# Patient Record
Sex: Male | Born: 1970 | Race: White | Hispanic: No | Marital: Married | Smoking: Never smoker
Health system: Southern US, Community
[De-identification: ages and names within clinical notes are randomized; demographics above are authoritative.]

## PROBLEM LIST (undated history)

## (undated) DIAGNOSIS — E785 Hyperlipidemia, unspecified: Secondary | ICD-10-CM

## (undated) DIAGNOSIS — K449 Diaphragmatic hernia without obstruction or gangrene: Secondary | ICD-10-CM

## (undated) DIAGNOSIS — K219 Gastro-esophageal reflux disease without esophagitis: Secondary | ICD-10-CM

## (undated) HISTORY — DX: Hyperlipidemia, unspecified: E78.5

## (undated) HISTORY — DX: Diaphragmatic hernia without obstruction or gangrene: K44.9

## (undated) HISTORY — PX: ULNAR NERVE REPAIR: SHX2594

## (undated) HISTORY — DX: Gastro-esophageal reflux disease without esophagitis: K21.9

---

## 2003-05-06 HISTORY — PX: ELBOW ARTHROSCOPY: SUR87

## 2008-09-20 ENCOUNTER — Encounter: Payer: Self-pay | Admitting: Family Medicine

## 2009-02-28 ENCOUNTER — Encounter: Payer: Self-pay | Admitting: Family Medicine

## 2009-05-02 ENCOUNTER — Encounter: Payer: Self-pay | Admitting: Family Medicine

## 2009-07-12 ENCOUNTER — Encounter: Payer: Self-pay | Admitting: Family Medicine

## 2009-07-13 ENCOUNTER — Encounter: Payer: Self-pay | Admitting: Family Medicine

## 2009-08-08 ENCOUNTER — Ambulatory Visit: Payer: Self-pay | Admitting: Family Medicine

## 2009-08-08 DIAGNOSIS — M542 Cervicalgia: Secondary | ICD-10-CM | POA: Insufficient documentation

## 2009-08-08 DIAGNOSIS — K219 Gastro-esophageal reflux disease without esophagitis: Secondary | ICD-10-CM | POA: Insufficient documentation

## 2009-08-08 DIAGNOSIS — E785 Hyperlipidemia, unspecified: Secondary | ICD-10-CM | POA: Insufficient documentation

## 2009-08-08 DIAGNOSIS — G5620 Lesion of ulnar nerve, unspecified upper limb: Secondary | ICD-10-CM | POA: Insufficient documentation

## 2009-08-14 ENCOUNTER — Telehealth: Payer: Self-pay | Admitting: Family Medicine

## 2009-09-05 ENCOUNTER — Ambulatory Visit: Payer: Self-pay | Admitting: Family Medicine

## 2009-09-05 DIAGNOSIS — G8190 Hemiplegia, unspecified affecting unspecified side: Secondary | ICD-10-CM | POA: Insufficient documentation

## 2009-09-05 DIAGNOSIS — M5412 Radiculopathy, cervical region: Secondary | ICD-10-CM | POA: Insufficient documentation

## 2009-09-07 ENCOUNTER — Encounter: Admission: RE | Admit: 2009-09-07 | Discharge: 2009-09-07 | Payer: Self-pay | Admitting: Family Medicine

## 2010-01-09 ENCOUNTER — Ambulatory Visit: Payer: Self-pay | Admitting: Family Medicine

## 2010-01-09 DIAGNOSIS — N529 Male erectile dysfunction, unspecified: Secondary | ICD-10-CM

## 2010-01-09 DIAGNOSIS — G4700 Insomnia, unspecified: Secondary | ICD-10-CM

## 2010-02-21 ENCOUNTER — Telehealth: Payer: Self-pay | Admitting: Family Medicine

## 2010-04-12 ENCOUNTER — Encounter: Payer: Self-pay | Admitting: Family Medicine

## 2010-06-04 NOTE — Assessment & Plan Note (Signed)
Summary: SLEEP PROBLEMS CYD   Vital Signs:  Patient profile:   40 year old male Height:      66.75 inches Weight:      182 pounds BMI:     28.82 Temp:     98.8 degrees F oral Pulse rate:   68 / minute Pulse rhythm:   regular BP sitting:   122 / 80  (right arm) Cuff size:   regular  Vitals Entered By: Linde Gillis CMA Duncan Dull) (January 09, 2010 3:11 PM) CC: trouble sleeping   History of Present Illness: 40 year old male:  Having a hard time sleeping. Ambien works pretty well for him.  Not a new things.   caffeine not after lunch occ breaks rule    Allergies: 1)  ! Sulfa   Impression & Recommendations:  Problem # 1:  INSOMNIA (ICD-780.52) >15 minutes spent in face to face time with patient, >50% spent in counselling or coordination of care: discussed sleep hygiene, stress. OTC meds, supplements. No tob, 2 cups of coffee a day.  Please see the patient instructions for a detailed list of plans and what was discussed with the patient.   His updated medication list for this problem includes:    Zolpidem Tartrate 10 Mg Tabs (Zolpidem tartrate) .Marland Kitchen... 1/2 or 1 by mouth at hs prn  Problem # 2:  ERECTILE DYSFUNCTION, ORGANIC (ICD-607.84)  His updated medication list for this problem includes:    Cialis 20 Mg Tabs (Tadalafil) .Marland Kitchen... 1 by mouth 30 mins before sex  Complete Medication List: 1)  Simvastatin 20 Mg Tabs (Simvastatin) .... Once everyday 2)  Zolpidem Tartrate 10 Mg Tabs (Zolpidem tartrate) .... 1/2 or 1 by mouth at hs prn 3)  Cialis 20 Mg Tabs (Tadalafil) .Marland Kitchen.. 1 by mouth 30 mins before sex  Patient Instructions: 1)  MELATONIN 3-5 MG by mouth 1 HOUR BEFORE NIGHT 2)  (ONE OF THE BELOW IS OK, NOT ALL AT ONCE) 3)  BENADRYL 25 - 50 MG by mouth PRIOR TO SLEEP 4)  UNISOM 1 PRIOR TO SLEEP 5)  DRAMAMINE 1 TAB PRIOR TO SLEEP Prescriptions: CIALIS 20 MG TABS (TADALAFIL) 1 by mouth 30 mins before sex  #4 x 11   Entered and Authorized by:   Hannah Beat MD   Signed by:    Hannah Beat MD on 01/09/2010   Method used:   Electronically to        [facility]* (retail)       [address]       [place], [place]  [address]       Ph: [phone]       Fax: [phone]   RxID:   [id number] ZOLPIDEM TARTRATE 10 MG  TABS (ZOLPIDEM TARTRATE) 1/2 or 1 by mouth at hs prn  #30 x 1   Entered and Authorized by:   Hannah Beat MD   Signed by:   Hannah Beat MD on 01/09/2010   Method used:   Print then Give to Patient   RxID:   [id number]   Current Allergies (reviewed today): ! SULFA

## 2010-06-04 NOTE — Progress Notes (Signed)
Summary: Neurosurg Referral update...  Phone Note Outgoing Call   Summary of Call: Called pt regarding Neurosurg referral. Pt says he went to the appt, and the physician did not know why he was there. Says the physician reviewed the notes. Pt says he never went back.  Calling Neurosurg office for notes.Daine Gip  February 21, 2010 10:06 AM  Initial call taken by: Daine Gip,  February 21, 2010 10:06 AM  Follow-up for Phone Call        ok, noted, complex case.  Follow-up by: Hannah Beat MD,  March 11, 2010 9:06 AM

## 2010-06-04 NOTE — Op Note (Signed)
Summary: Right Ulnar Nerve Decompression/[employer] in [place]  Right Ulnar Nerve Decompression/[employer] in [place]. [place]   Imported By: Maryln Gottron 08/13/2009 14:06:44  _____________________________________________________________________  External Attachment:    Type:   Image     Comment:   External Document

## 2010-06-04 NOTE — Assessment & Plan Note (Signed)
Summary: NEW PT TO EST/CLE   Vital Signs:  Patient profile:   40 year old male Height:      66.75 inches Weight:      184.4 pounds BMI:     29.20 Temp:     97.9 degrees F oral Pulse rate:   72 / minute Pulse rhythm:   regular BP sitting:   120 / 80  (left arm) Cuff size:   regular  Vitals Entered By: Benny Lennert CMA Duncan Dull) (Sep 05, 2009 8:59 AM)  History of Present Illness: Chief complaint new patient to be established   40 year old male:  Minimal improvement to no improvement in the neck. 3 months. Saw surgeon at [facility] again, additionally has seen 2 other neurologists at [facility]. Wanted to try Botox  Several neurologists in [facility] History of [facility] tumor in the family  Neck and scapular pain: Has been ongoing now for about 3 months. Has had some ulnar nerve issues that are chronic and some weakness. Had an exploratory surgery - in [place]. Did not really help. Not a decompression or transposition.   Now has developed some pain on the right side of his neck.   Also R medial trap is having some severe to moderate pain in that side.  Does not exercise too much anymore.   has done some whoulder work and some light shurugs.   Went to Dr. Jaclyn Shaggy, [place] and some adjustments with his neck. No real benefit from manipulation. A little straighter.  Additionally, has been to PT most recently, Tried some Voltaren and Zanaflex  EMG normal, R UE, done at [facility]. Showed some mild slowing.   Chol: on statin, stable, needs Zocor refills. Recent labs reviewed.  Allergies: 1)  ! Sulfa  Past History:  Past medical, surgical, family and social histories (including risk factors) reviewed, and no changes noted (except as noted below).  Past Medical History: Reviewed history from 08/08/2009 and no changes required. Hyperlipidemia GERD / hiatal hernia  Past Surgical History: Reviewed history from 08/08/2009 and no changes required. 2005, R elbow arthroscopy 2010, Exploratory R ulnar  nerve, chronic ulnar nerve partial   Family History: Reviewed history and no changes required. 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 MS:  See HPI. Neuro:  See HPI. Psych:  Denies depression; increased stress with inability to fully work.  Physical Exam  General:  Well-developed,well-nourished,in no acute distress; alert,appropriate and cooperative throughout examination Head:  Normocephalic and atraumatic without obvious abnormalities. No apparent alopecia or balding. Ears:  no external deformities.   Nose:  no external deformity.   Lungs:  Normal respiratory effort, chest expands symmetrically. Lungs are clear to auscultation, no crackles or wheezes. Heart:  Normal rate and regular rhythm. S1 and S2 normal without gallop, murmur, click, rub or other extra sounds. 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 Some neuropathic pain and decreased mild strength. Will obtain an MRI of the cervical spine  and brachial plexus to evaluate for potential cervical foraminal impingement, spinal cord edema, spinal stenosis, brachial plexus lesion, and given a family history of PANCO tumor.  MRI will dictate direction for subsequent follow-up.  Orders: Radiology Referral (Radiology)  Problem # 2:  WEAKNESS, RIGHT SIDE OF BODY (ICD-342.90)  Problem # 3:  CERVICAL RADICULOPATHY, RIGHT (ICD-723.4) additionally, reviewed scapular control exercises.  Problem # 4:   HYPERLIPIDEMIA (ICD-272.4)  His updated medication list for this problem includes:    Simvastatin 20 Mg Tabs (Simvastatin) ..... Once everyday  Complete Medication List: 1)  Simvastatin 20 Mg Tabs (Simvastatin) .... Once everyday 2)  Diclofenac Sodium 75 Mg Tbec (Diclofenac sodium) .Marland Kitchen.. 1 by mouth two times a day 3)  Tizanidine Hcl 4 Mg Tabs (Tizanidine hcl) .Marland Kitchen.. 1 by mouth at bedtime  Patient Instructions: 1)  Referral Appointment Information 2)  Day/Date: 3)  Time: 4)  Place/MD: 5)  Address: 6)  Phone/Fax: 7)  Patient given appointment information. Information/Orders faxed/mailed.  Prescriptions: SIMVASTATIN 20 MG TABS (SIMVASTATIN) once everyday  #30 x 11   Entered and Authorized by:   Hannah Beat MD   Signed by:   Hannah Beat MD on 09/05/2009   Method used:   Electronically to        [facility]* (retail)       [address]       [place], [place]  [address]       Ph: [phone]       Fax: [phone]   RxID:   [id number]   Current Allergies (reviewed today): ! SULFA

## 2010-06-04 NOTE — Assessment & Plan Note (Signed)
Summary: 30 MIN APPT. NECK PAIN PER DR Nhung Danko   Vital Signs:  Patient profile:   40 year old male Height:      66.75 inches Weight:      181.2 pounds BMI:     28.70 Temp:     97.8 degrees F oral Pulse rate:   72 / minute Pulse rhythm:   regular BP sitting:   118 / 78  (left arm) Cuff size:   regular  Vitals Entered By: Benny Lennert CMA Duncan Dull) (August 08, 2009 9:00 AM)  History of Present Illness: Chief complaint neck pain for 2 months  Has been ongoing now for about two months. Has had some ulnar nerve issues that are chronic and some weakness. Had an exploratory surgery - in [place]. Did not really help. Not a decompression or transposition.   Now has developed some pain on the right side of his neck.   Also had  a cough and some difficulty swallowing, but that continued.   Also R medial trap is having some severe to moderate pain in that side.  Does not exercise too much anymore.  Has done a little exercise, has done some whoulder work and some light shurugs.   Went to Dr. Jaclyn Shaggy, [place] and some adjustments with his neck. No real benefit from manipulation. A little straighter.   EMG normal, done at [facility]. Showed some mild slowing.   Seeing a PT next wed, to check str of hand. Sue Lush at [facility], [employer]. Having some massage therapy.  Did not have do much about his neck.     Pictures reviewed with some slight R sided SCM smaller compared to the R.  Chol: on statin, stable  Allergies (verified): 1)  ! 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 MS:  See HPI. Neuro:  See HPI.  Physical Exam  General:  Well-developed,well-nourished,in no acute distress; alert,appropriate and  cooperative throughout examination Head:  Normocephalic and atraumatic without obvious abnormalities. No apparent alopecia or balding. Ears:  no external deformities.   Nose:  no external deformity.   Neck:  No deformities, masses, or tenderness noted. Chest Wall:  No deformities, masses, tenderness or gynecomastia noted. Lungs:  normal respiratory effort.   Msk:  Grossly full ROM at cervical spine Neg spurlings nt at spinous processes  Prominent R elbow scar grip 5/5   Impression & Recommendations:  Problem # 1:  NECK PAIN (ICD-723.1) Assessment New cannot rule out small amount of foraminal impingement  ddd vs oa cannot be excluded  proceed conservatively - PT, then manipulation if no cont improvement  His updated medication list for this problem includes:    Diclofenac Sodium 75 Mg Tbec (Diclofenac sodium) .Marland Kitchen... 1 by mouth two times a day    Tizanidine Hcl 4 Mg Tabs (Tizanidine hcl) .Marland Kitchen... 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 Ave Filter or one of the other local elbow surgeons.  Problem # 3:  HYPERLIPIDEMIA (ICD-272.4)  His updated medication list for this problem includes:    Simvastatin 20 Mg Tabs (Simvastatin) ..... Once everyday  Complete Medication List: 1)  Simvastatin 20 Mg Tabs (Simvastatin) .... Once everyday 2)  Diclofenac Sodium 75 Mg Tbec (Diclofenac sodium) .Marland Kitchen.. 1 by mouth two times a day 3)  Tizanidine Hcl 4 Mg Tabs (Tizanidine hcl) .Marland Kitchen.. 1 by mouth at bedtime  Patient Instructions: 1)  Elbow surgeons: 2)  Jackquline Bosch: [facility] 3)  Referral Appointment Information 4)  Day/Date: 5)  Time: 6)  Place/MD: 7)  Address: 8)  Phone/Fax: 9)  Patient given appointment information. Information/Orders faxed/mailed.  Prescriptions: TIZANIDINE HCL 4 MG TABS (TIZANIDINE HCL) 1 by mouth at bedtime  #30 x 1   Entered and Authorized by:    Hannah Beat MD   Signed by:   Hannah Beat MD on 08/08/2009   Method used:   Print then Give to Patient   RxID:   [id number] DICLOFENAC SODIUM 75 MG TBEC (DICLOFENAC SODIUM) 1 by mouth two times a day  #60 x 1   Entered and Authorized by:   Hannah Beat MD   Signed by:   Hannah Beat MD on 08/08/2009   Method used:   Print then Give to Patient   RxID:   [id number]   Current Allergies (reviewed today): ! SULFA

## 2010-06-04 NOTE — Letter (Signed)
Summary: Patient Questionnaire  Patient Questionnaire   Imported By: Beau Fanny 08/10/2009 13:39:02  _____________________________________________________________________  External Attachment:    Type:   Image     Comment:   External Document

## 2010-06-04 NOTE — Miscellaneous (Signed)
Summary: Records from 1974 - 2010  Records from 1974 - 2010   Imported By: Maryln Gottron 08/13/2009 14:10:21  _____________________________________________________________________  External Attachment:    Type:   Image     Comment:   External Document

## 2010-06-04 NOTE — Progress Notes (Signed)
  Phone Note Call from Patient   Caller: Patient Summary of Call: Patient called to ask if you have had the opportunity to speak to Dr Jackquline Bosch yet? He asked me to go ahead and make him the consult. I am not sure what the reason is for this consult. Pls advise so I can make this appt for him. Wednesday and Thursday are his best days, Mondays are 3rd choice, No Tuesdays and Fridays are his last choice. Cell is best [phone]. Patient thought by making the appt that it would take awhile and this would give you a chance to speak to Dr Ave Filter about him. Initial call taken by: Carlton Adam,  August 14, 2009 8:22 AM  Follow-up for Phone Call        done Follow-up by: Hannah Beat MD,  August 14, 2009 8:33 AM

## 2010-06-06 NOTE — Letter (Signed)
Summary: [facility]  [facility]   Imported By: Maryln Gottron 04/19/2010 14:55:21  _____________________________________________________________________  External Attachment:    Type:   Image     Comment:   External Document

## 2010-06-14 ENCOUNTER — Encounter: Payer: Self-pay | Admitting: Family Medicine

## 2010-06-26 NOTE — Letter (Signed)
Summary: [facility]  [facility]   Imported By: Maryln Gottron 06/21/2010 13:05:53  _____________________________________________________________________  External Attachment:    Type:   Image     Comment:   External Document

## 2010-07-11 ENCOUNTER — Encounter: Payer: Self-pay | Admitting: Family Medicine

## 2010-08-05 ENCOUNTER — Telehealth: Payer: Self-pay | Admitting: *Deleted

## 2010-08-05 DIAGNOSIS — M542 Cervicalgia: Secondary | ICD-10-CM

## 2010-08-05 NOTE — Telephone Encounter (Signed)
Pt wants referral to physical therapist at [facility], for right side neck pain.

## 2010-08-07 ENCOUNTER — Telehealth: Payer: Self-pay | Admitting: *Deleted

## 2010-08-07 NOTE — Telephone Encounter (Signed)
I did this 2 days ago. Did it not work somehow, please respond yes or no or if still pending.

## 2010-08-07 NOTE — Telephone Encounter (Signed)
Physical therapist from [facility] called stating that pt needs written order for physical therapy for neck and right shoulder problem.  Says he has had order before, has ongoing problems with pain.  Please fax to [phone].

## 2010-08-21 ENCOUNTER — Encounter: Payer: Self-pay | Admitting: Family Medicine

## 2010-08-27 NOTE — Telephone Encounter (Signed)
Per Shirlee Limerick, this has been done.

## 2010-09-03 ENCOUNTER — Other Ambulatory Visit: Payer: Self-pay | Admitting: Family Medicine

## 2010-09-03 ENCOUNTER — Encounter: Payer: Self-pay | Admitting: Family Medicine

## 2010-09-03 DIAGNOSIS — Z131 Encounter for screening for diabetes mellitus: Secondary | ICD-10-CM

## 2010-09-03 DIAGNOSIS — R5381 Other malaise: Secondary | ICD-10-CM

## 2010-09-03 DIAGNOSIS — Z13220 Encounter for screening for lipoid disorders: Secondary | ICD-10-CM

## 2010-09-06 ENCOUNTER — Other Ambulatory Visit: Payer: Self-pay

## 2010-09-09 ENCOUNTER — Other Ambulatory Visit: Payer: Self-pay | Admitting: *Deleted

## 2010-09-09 MED ORDER — SIMVASTATIN 20 MG PO TABS: 20.0000 mg | ORAL_TABLET | Freq: Every day | ORAL | Status: DC

## 2010-09-10 MED ORDER — ZOLPIDEM TARTRATE 10 MG PO TABS: ORAL_TABLET | ORAL | Status: DC

## 2010-09-10 NOTE — Telephone Encounter (Signed)
Please call in ambien 10 mg, as written, #30, 2 refills

## 2010-09-11 ENCOUNTER — Encounter: Payer: Self-pay | Admitting: Family Medicine

## 2010-09-20 ENCOUNTER — Other Ambulatory Visit: Payer: Self-pay

## 2010-09-25 ENCOUNTER — Encounter: Payer: Self-pay | Admitting: Family Medicine

## 2010-10-02 ENCOUNTER — Telehealth: Payer: Self-pay | Admitting: *Deleted

## 2010-10-02 NOTE — Telephone Encounter (Signed)
Pt is asking that you call him regarding his needing to go on antidepressant.  He is seeing a psychologist who recommends that he take one for about 8 months, pt doesn't really want to do that and wants to discuss it with you.  Try home number first and then mobile number.

## 2010-10-04 ENCOUNTER — Encounter: Payer: Self-pay | Admitting: Family Medicine

## 2010-11-11 ENCOUNTER — Other Ambulatory Visit: Payer: Self-pay | Admitting: *Deleted

## 2010-11-11 NOTE — Telephone Encounter (Signed)
I did not write for this. He was seeing a psychiatrist who wrote for medication, refill should go to them. Let pharmacy know we are not prescribing MD.

## 2010-11-11 NOTE — Telephone Encounter (Signed)
This medication was not on medication list, please advise.

## 2011-04-02 ENCOUNTER — Other Ambulatory Visit (INDEPENDENT_AMBULATORY_CARE_PROVIDER_SITE_OTHER): Payer: BC Managed Care – PPO

## 2011-04-02 DIAGNOSIS — R5381 Other malaise: Secondary | ICD-10-CM

## 2011-04-02 DIAGNOSIS — Z131 Encounter for screening for diabetes mellitus: Secondary | ICD-10-CM

## 2011-04-02 DIAGNOSIS — Z13220 Encounter for screening for lipoid disorders: Secondary | ICD-10-CM

## 2011-04-02 LAB — BASIC METABOLIC PANEL
BUN: 14 mg/dL (ref 6–23)
Calcium: 9.1 mg/dL (ref 8.4–10.5)
Chloride: 102 mEq/L (ref 96–112)
Creatinine, Ser: 1.1 mg/dL (ref 0.4–1.5)
GFR: 83.97 mL/min (ref >60.00)

## 2011-04-02 LAB — CBC WITH DIFFERENTIAL/PLATELET
Basophils Absolute: 0 10*3/uL (ref 0.0–0.1)
Eosinophils Relative: 5.7 % — ABNORMAL HIGH (ref 0.0–5.0)
HCT: 45.6 % (ref 39.0–52.0)
Lymphs Abs: 1.7 10*3/uL (ref 0.7–4.0)
MCV: 88.9 fl (ref 78.0–100.0)
Monocytes Absolute: 0.5 10*3/uL (ref 0.1–1.0)
Monocytes Relative: 8.1 % (ref 3.0–12.0)
Neutrophils Relative %: 59 % (ref 43.0–77.0)
Platelets: 253 10*3/uL (ref 150.0–400.0)
RBC: 5.13 Mil/uL (ref 4.22–5.81)
RDW: 12.8 % (ref 11.5–14.6)

## 2011-04-02 LAB — HEPATIC FUNCTION PANEL
AST: 31 U/L (ref 0–37)
Albumin: 3.5 g/dL (ref 3.5–5.2)
Alkaline Phosphatase: 67 U/L (ref 39–117)
Bilirubin, Direct: 0.1 mg/dL (ref 0.0–0.3)
Total Protein: 7 g/dL (ref 6.0–8.3)

## 2011-04-02 LAB — LIPID PANEL: LDL Cholesterol: 104 mg/dL — ABNORMAL HIGH (ref 0–99)

## 2011-04-07 ENCOUNTER — Ambulatory Visit (INDEPENDENT_AMBULATORY_CARE_PROVIDER_SITE_OTHER): Payer: BC Managed Care – PPO | Admitting: Family Medicine

## 2011-04-07 ENCOUNTER — Encounter: Payer: Self-pay | Admitting: Family Medicine

## 2011-04-07 VITALS — BP 130/80 | HR 68 | Temp 98.2°F | Ht 67.0 in | Wt 184.8 lb

## 2011-04-07 DIAGNOSIS — M722 Plantar fascial fibromatosis: Secondary | ICD-10-CM

## 2011-04-07 DIAGNOSIS — Z0000 Encounter for general adult medical examination without abnormal findings: Secondary | ICD-10-CM

## 2011-04-07 DIAGNOSIS — N529 Male erectile dysfunction, unspecified: Secondary | ICD-10-CM

## 2011-04-07 DIAGNOSIS — Z23 Encounter for immunization: Secondary | ICD-10-CM

## 2011-04-07 MED ORDER — VARDENAFIL HCL 20 MG PO TABS: 20.0000 mg | ORAL_TABLET | ORAL | Status: DC | PRN

## 2011-04-07 NOTE — Progress Notes (Signed)
Patient Name: JAKYE MULLENS Date of Birth: [birth year] Age: 40 y.o. Medical Record Number: [id number] Gender: male  History of Present Illness:  JAIRE PINKHAM is a 40 y.o. very pleasant male patient who presents with the following:  Flu - flu mist tdap -   Preventative Health Maintenance Visit:  Health Maintenance Summary Reviewed and updated, unless pt declines services.  Tobacco History Reviewed. Alcohol: No concerns, no excessive use Exercise Habits: minimal right now STD concerns: no risk or activity to increase risk Drug Use: None Encouraged self-testicular check  Health Maintenance  Topic Date Due  . Influenza Vaccine  02/03/2012  . Tetanus/tdap  04/06/2021    Labs reviewed with the patient.   Lipids:    Component Value Date/Time   CHOL 184 04/02/2011 0753   TRIG 140.0 04/02/2011 0753   HDL 52.40 04/02/2011 0753   VLDL 28.0 04/02/2011 0753   CHOLHDL 4 04/02/2011 0753    CBC:    Component Value Date/Time   WBC 6.5 04/02/2011 0753   HGB 15.4 04/02/2011 0753   HCT 45.6 04/02/2011 0753   PLT 253.0 04/02/2011 0753   MCV 88.9 04/02/2011 0753   NEUTROABS 3.8 04/02/2011 0753   LYMPHSABS 1.7 04/02/2011 0753   MONOABS 0.5 04/02/2011 0753   EOSABS 0.4 04/02/2011 0753   BASOSABS 0.0 04/02/2011 0753    Basic Metabolic Panel:    Component Value Date/Time   NA 139 04/02/2011 0753   K 4.2 04/02/2011 0753   CL 102 04/02/2011 0753   CO2 29 04/02/2011 0753   BUN 14 04/02/2011 0753   CREATININE 1.1 04/02/2011 0753   GLUCOSE 93 04/02/2011 0753   CALCIUM 9.1 04/02/2011 0753    Lab Results  Component Value Date   ALT 36 04/02/2011   AST 31 04/02/2011   ALKPHOS 67 04/02/2011   BILITOT 0.8 04/02/2011     Plantar fascitis - first [place] on the beach.  Having some plantar fascitis - has gotten a lot worse recently. Hobbling to the bathroom -- now not only when going to the bathroom, but when getting up.   The patient presents with a 2 year long  history of heel pain. This is notable for worsening pain first thing in the morning when arising and standing after sitting.   Prior foot or ankle fractures: none Prior operations: none Orthotics or bracing: custom orthotics Medications: NSAIDS PT or home rehab: none Night splints: no Ice massage: no Ball massage: no  Metatarsal pain: no  Patient Active Problem List  Diagnoses  . HYPERLIPIDEMIA  . WEAKNESS, RIGHT SIDE OF BODY  . ULNAR NEUROPATHY, RIGHT  . GERD  . ERECTILE DYSFUNCTION, ORGANIC  . NECK PAIN  . CERVICAL RADICULOPATHY, RIGHT  . INSOMNIA   Past Medical History  Diagnosis Date  . Hyperlipidemia   . GERD (gastroesophageal reflux disease)   . Hiatal hernia    Past Surgical History  Procedure Date  . Elbow arthroscopy 2005    right  . Ulnar nerve repair     exploratory right, chronic ulnar nerve partial   History  Substance Use Topics  . Smoking status: Never Smoker   . Smokeless tobacco: Not on file  . Alcohol Use: Yes   No family history on file. Allergies  Allergen Reactions  . Sulfonamide Derivatives      Review of Systems:  General: Denies fever, chills, sweats. No significant weight loss. Eyes: Denies blurring,significant itching ENT: Denies earache, sore throat, and hoarseness. Cardiovascular: Denies chest pains,  palpitations, dyspnea on exertion Respiratory: Denies cough, dyspnea at rest,wheeezing Breast: no concerns about lumps GI: Denies nausea, vomiting, diarrhea, constipation, change in bowel habits, abdominal pain, melena, hematochezia. HAVING SOME SENSATION OF FOOD CATCHING WHILE SWALLOWING - HAS AN UPCOMING EGD WITH DR. Markham Jordan GU: Denies penile discharge, OCC ED, NO urinary flow / outflow problems. No STD concerns. Musculoskeletal: CONT PROBLEMS WITH R HAND / ULNAR NERVE, SOME ME SYMPTOMS AS WELL. Derm: Denies rash, itching Neuro: Denies  paresthesias, frequent falls, frequent headaches Psych: Denies depression, anxiety Endocrine:  Denies cold intolerance, heat intolerance, polydipsia Heme: Denies enlarged lymph nodes Allergy: No hayfever   Physical Examination: Filed Vitals:   04/07/11 1156  BP: 130/80  Pulse: 68  Temp: 98.2 F (36.8 C)  TempSrc: Oral  Height: 5\' 7"  (1.702 m)  Weight: 184 lb 12.8 oz (83.825 kg)  SpO2: 99%   Body mass index is 28.94 kg/(m^2).   Wt Readings from Last 3 Encounters:  04/07/11 184 lb 12.8 oz (83.825 kg)  01/09/10 182 lb (82.555 kg)  09/05/09 184 lb 6.4 oz (83.643 kg)    GEN: well developed, well nourished, no acute distress Eyes: conjunctiva and lids normal, PERRLA, EOMI ENT: TM clear, nares clear, oral exam WNL Neck: supple, no lymphadenopathy, no thyromegaly, no JVD Pulm: clear to auscultation and percussion, respiratory effort normal CV: regular rate and rhythm, S1-S2, no murmur, rub or gallop, no bruits, peripheral pulses normal and symmetric, no cyanosis, clubbing, edema or varicosities Chest: no scars, masses GI: soft, non-tender; no hepatosplenomegaly, masses; active bowel sounds all quadrants GU: no hernia, testicular mass, penile discharge Lymph: no cervical, axillary or inguinal adenopathy MSK: gait normal, muscle tone and strength WNL, no joint swelling, effusions, discoloration, crepitus  SKIN: clear, good turgor, color WNL, no rashes, lesions, or ulcerations Neuro: normal mental status, normal strength, sensation, and motion Psych: alert; oriented to person, place and time, normally interactive and not anxious or depressed in appearance.   Foot exam, r Echymosis: no Edema: no ROM: full LE B Gait: heel toe, non-antalgic MT pain: no Callus pattern: none Lateral Mall: NT Medial Mall: NT Talus: NT Navicular: NT Calcaneous: NT Metatarsals: NT 5th MT: NT Phalanges: NT Achilles: NT Plantar Fascia: tender, medial along PF. Pain with forced dorsi Fat Pad: NT Peroneals: NT Post Tib: NT Great Toe: Nml motion Ant Drawer: neg Other foot breakdown:  none Long arch: preserved Transverse arch: preserved Hindfoot breakdown: none Sensation: intact   Assessment and Plan:  1. Routine general medical examination at a health care facility    2. Need for Tdap vaccination  Tdap vaccine greater than or equal to 7yo IM  3. Plantar fascia syndrome     The patient's preventative maintenance and recommended screening tests for an annual wellness exam were reviewed in full today. Brought up to date unless services declined.  Counselled on the importance of diet, exercise, and its role in overall health and mortality. The patient's FH and SH was reviewed, including their home life, tobacco status, and drug and alcohol status.   Plantar Fascitis: We reviewed that stretching is critically important to the treatment of PF. Reviewed footwear. Rigid soles have been shown to help with PF. Reviewed rehab of stretching and calf raises.  Refer to the patient instructions sections for details of plan shared with patient.   ED: trial of Levitra, change from cialis -- 10 dose there losing some effectiveness  Orders Placed This Encounter  Procedures  . Tdap vaccine greater than or equal  to 7yo IM

## 2011-04-07 NOTE — Patient Instructions (Addendum)
SHOES: Danskos, Merrells, Keens, Lawrence, Finn Comfort - good arch support, want minimal bendability [employer] in [place]: excellent staff, shoe selection, OTC orthotics Shoes: Birkenstock shoes, [employer] THE [place], [address] [place], [place]   PF:  STRETCHING and Strengthening program critically important.  Strengthening on foot and calf muscles as seen in handout. Calf raises, 2 legged, then 1 legged. Foot massage with tennis ball. Ice massage.  Towel Scrunches: get a towel or hand towel, use toes to pick up and scrunch up the towel.  Marble pick-ups, practice picking up marbles with toes and placing into a cup  NEEDS TO BE DONE EVERY DAY  Recommended over the counter insoles. (Spenco or Hapad)  A rigid shoe with good arch support helps: Dansko (great), Randel Pigg, Merrell No easily bendable shoes.   Tuli's heel cups

## 2011-04-25 ENCOUNTER — Ambulatory Visit: Payer: Self-pay | Admitting: Unknown Physician Specialty

## 2011-05-05 ENCOUNTER — Ambulatory Visit: Payer: BC Managed Care – PPO | Admitting: Family Medicine

## 2011-05-21 ENCOUNTER — Encounter: Payer: Self-pay | Admitting: Family Medicine

## 2011-06-27 ENCOUNTER — Ambulatory Visit: Payer: Self-pay | Admitting: Unknown Physician Specialty

## 2011-08-04 ENCOUNTER — Other Ambulatory Visit: Payer: Self-pay | Admitting: *Deleted

## 2011-08-08 ENCOUNTER — Other Ambulatory Visit: Payer: Self-pay | Admitting: *Deleted

## 2011-08-08 NOTE — Telephone Encounter (Signed)
Patient requesting rx for cialis to be sent to [facility]. [facility] but, doesn't like that medication

## 2011-08-10 NOTE — Telephone Encounter (Signed)
cialis 20 mg, 1/2 to 1 po 30 min before intercourse, #10, 11 refills  Please place on med list

## 2011-08-11 MED ORDER — TADALAFIL 20 MG PO TABS
20.0000 mg | ORAL_TABLET | Freq: Every day | ORAL | Status: DC | PRN
Start: 2011-08-11 — End: 2012-07-14

## 2011-09-19 ENCOUNTER — Other Ambulatory Visit: Payer: Self-pay | Admitting: *Deleted

## 2011-09-19 MED ORDER — SIMVASTATIN 20 MG PO TABS: 20.0000 mg | ORAL_TABLET | Freq: Every day | ORAL | Status: DC

## 2012-06-28 ENCOUNTER — Other Ambulatory Visit (INDEPENDENT_AMBULATORY_CARE_PROVIDER_SITE_OTHER): Payer: BC Managed Care – PPO

## 2012-06-28 DIAGNOSIS — Z79899 Other long term (current) drug therapy: Secondary | ICD-10-CM

## 2012-06-28 DIAGNOSIS — Z13220 Encounter for screening for lipoid disorders: Secondary | ICD-10-CM

## 2012-06-28 LAB — CBC WITH DIFFERENTIAL/PLATELET
Basophils Absolute: 0 10*3/uL (ref 0.0–0.1)
Eosinophils Absolute: 0.2 10*3/uL (ref 0.0–0.7)
Lymphs Abs: 1.9 10*3/uL (ref 0.7–4.0)
Monocytes Absolute: 0.5 10*3/uL (ref 0.1–1.0)
Neutro Abs: 4.3 10*3/uL (ref 1.4–7.7)
Neutrophils Relative %: 61.6 % (ref 43.0–77.0)
Platelets: 300 10*3/uL (ref 150.0–400.0)
RBC: 5.18 Mil/uL (ref 4.22–5.81)
RDW: 12.9 % (ref 11.5–14.6)
WBC: 7 10*3/uL (ref 4.5–10.5)

## 2012-06-28 LAB — HEPATIC FUNCTION PANEL
ALT: 33 U/L (ref 0–53)
Albumin: 3.8 g/dL (ref 3.5–5.2)
Total Bilirubin: 0.4 mg/dL (ref 0.3–1.2)
Total Protein: 7.5 g/dL (ref 6.0–8.3)

## 2012-06-28 LAB — BASIC METABOLIC PANEL
BUN: 11 mg/dL (ref 6–23)
Calcium: 9.5 mg/dL (ref 8.4–10.5)
Chloride: 105 mEq/L (ref 96–112)
Creatinine, Ser: 0.9 mg/dL (ref 0.4–1.5)
Potassium: 4.1 mEq/L (ref 3.5–5.1)

## 2012-07-14 ENCOUNTER — Ambulatory Visit (INDEPENDENT_AMBULATORY_CARE_PROVIDER_SITE_OTHER): Payer: BC Managed Care – PPO | Admitting: Family Medicine

## 2012-07-14 ENCOUNTER — Encounter: Payer: Self-pay | Admitting: Family Medicine

## 2012-07-14 VITALS — BP 120/78 | HR 70 | Temp 98.3°F | Ht 67.0 in | Wt 188.8 lb

## 2012-07-14 DIAGNOSIS — G5620 Lesion of ulnar nerve, unspecified upper limb: Secondary | ICD-10-CM

## 2012-07-14 DIAGNOSIS — Z0000 Encounter for general adult medical examination without abnormal findings: Secondary | ICD-10-CM

## 2012-07-14 DIAGNOSIS — G5621 Lesion of ulnar nerve, right upper limb: Secondary | ICD-10-CM

## 2012-07-14 MED ORDER — TADALAFIL 20 MG PO TABS: 20.0000 mg | ORAL_TABLET | Freq: Every day | ORAL | Status: DC | PRN

## 2012-07-14 NOTE — Progress Notes (Signed)
Nature conservation officer at [facility] [address] [place] [address] Phone: [phone] Fax: [phone]  Date:  07/14/2012   Name:  Jesse Horn   DOB:  10-31-1970   MRN:  [id number] Gender: male Age: 42 y.o.  Primary Physician:  Hannah Beat, MD  Evaluating MD: Hannah Beat, MD   Chief Complaint: Annual Exam   History of Present Illness:  Jesse Horn is a 42 y.o. pleasant patient who presents with the following:  42 yo:   R sided ulnar nerve problems continue.  Getting some CTS on the right.   He now has been able to work, and do everything from a dental standpoint, but he has had to alter his grip, and has minimal use of his 4th finger.  From a general health maintenance standpoint, he is doing well, however, he has not been able to work out quite as much as he wants to. His upper extremity lifting has been limited some with his elbow.  Preventative Health Maintenance Visit:  Health Maintenance Summary Reviewed and updated, unless pt declines services.  Tobacco History Reviewed. Alcohol: No concerns, no excessive use Exercise Habits: Some activity, rec at least 30 mins 5 times a week STD concerns: no risk or activity to increase risk Drug Use: None Encouraged self-testicular check  Health Maintenance  Topic Date Due  . Influenza Vaccine  01/04/2012  . Tetanus/tdap  04/06/2021    Labs reviewed with the patient.  Results for orders placed in visit on 06/28/12  HEPATIC FUNCTION PANEL      Result Value Range   Total Bilirubin 0.4  0.3 - 1.2 mg/dL   Bilirubin, Direct 0.0  0.0 - 0.3 mg/dL   Alkaline Phosphatase 61  39 - 117 U/L   AST 26  0 - 37 U/L   ALT 33  0 - 53 U/L   Total Protein 7.5  6.0 - 8.3 g/dL   Albumin 3.8  3.5 - 5.2 g/dL  CBC WITH DIFFERENTIAL      Result Value Range   WBC 7.0  4.5 - 10.5 K/uL   RBC 5.18  4.22 - 5.81 Mil/uL   Hemoglobin 15.1  13.0 - 17.0 g/dL   HCT 95.2  84.1 - 32.4 %   MCV 86.3  78.0 - 100.0 fl   MCHC 33.8  30.0 - 36.0 g/dL   RDW 40.1  02.7 - 25.3 %   Platelets 300.0  150.0 - 400.0 K/uL   Neutrophils Relative 61.6  43.0 - 77.0 %   Lymphocytes Relative 27.3  12.0 - 46.0 %   Monocytes Relative 7.2  3.0 - 12.0 %   Eosinophils Relative 3.2  0.0 - 5.0 %   Basophils Relative 0.7  0.0 - 3.0 %   Neutro Abs 4.3  1.4 - 7.7 K/uL   Lymphs Abs 1.9  0.7 - 4.0 K/uL   Monocytes Absolute 0.5  0.1 - 1.0 K/uL   Eosinophils Absolute 0.2  0.0 - 0.7 K/uL   Basophils Absolute 0.0  0.0 - 0.1 K/uL  BASIC METABOLIC PANEL      Result Value Range   Sodium 141  135 - 145 mEq/L   Potassium 4.1  3.5 - 5.1 mEq/L   Chloride 105  96 - 112 mEq/L   CO2 29  19 - 32 mEq/L   Glucose, Bld 87  70 - 99 mg/dL   BUN 11  6 - 23 mg/dL   Creatinine, Ser 0.9  0.4 - 1.5  mg/dL   Calcium 9.5  8.4 - 86.5 mg/dL   GFR 78.46  >96.29 mL/min  LDL CHOLESTEROL, DIRECT      Result Value Range   Direct LDL 97.8       Patient Active Problem List  Diagnosis  . HYPERLIPIDEMIA  . WEAKNESS, RIGHT SIDE OF BODY  . ULNAR NEUROPATHY, RIGHT  . GERD  . ERECTILE DYSFUNCTION, ORGANIC  . NECK PAIN  . CERVICAL RADICULOPATHY, RIGHT  . INSOMNIA    Past Medical History  Diagnosis Date  . Hyperlipidemia   . GERD (gastroesophageal reflux disease)   . Hiatal hernia     Past Surgical History  Procedure Laterality Date  . Elbow arthroscopy  2005    right  . Ulnar nerve repair      exploratory right, chronic ulnar nerve partial    History   Social History  . Marital Status: Married    Spouse Name: N/A    Number of Children: N/A  . Years of Education: N/A   Occupational History  . [employer]    Social History Main Topics  . Smoking status: Never Smoker   . Smokeless tobacco: Not on file  . Alcohol Use: Yes  . Drug Use:   . Sexually Active:    Other Topics Concern  . Not on file   Social History Narrative  . No narrative on file    No family history on file.  Allergies  Allergen Reactions  . Sulfonamide  Derivatives     Medication list has been reviewed and updated.  Current Outpatient Prescriptions on File Prior to Visit  Medication Sig Dispense Refill  . simvastatin (ZOCOR) 20 MG tablet Take 1 tablet (20 mg total) by mouth daily.  30 tablet  11   No current facility-administered medications on file prior to visit.     Review of Systems:   General: Denies fever, chills, sweats. No significant weight loss. Eyes: Denies blurring,significant itching ENT: Denies earache, sore throat, and hoarseness. Cardiovascular: Denies chest pains, palpitations, dyspnea on exertion Respiratory: Denies cough, dyspnea at rest,wheeezing Breast: no concerns about lumps GI: Denies nausea, vomiting, diarrhea, constipation, change in bowel habits, abdominal pain, melena, hematochezia GU: Denies penile discharge, ED, urinary flow / outflow problems. No STD concerns. Musculoskeletal: Denies back pain, CHRONIC ELBOW R PROBLEM WITH SOME NOW CHRONIC PAIN Derm: Denies rash, itching Neuro: AS ABOVE Psych: Denies depression, anxiety Endocrine: Denies cold intolerance, heat intolerance, polydipsia Heme: Denies enlarged lymph nodes Allergy: No hayfever   Physical Examination: BP 120/78  Pulse 70  Temp(Src) 98.3 F (36.8 C) (Oral)  Ht 5\' 7"  (1.702 m)  Wt 188 lb 12 oz (85.616 kg)  BMI 29.56 kg/m2  SpO2 97%  Ideal Body Weight: Weight in (lb) to have BMI = 25: 159.3  GEN: well developed, well nourished, no acute distress Eyes: conjunctiva and lids normal, PERRLA, EOMI ENT: TM clear, nares clear, oral exam WNL Neck: supple, no lymphadenopathy, no thyromegaly, no JVD Pulm: clear to auscultation and percussion, respiratory effort normal CV: regular rate and rhythm, S1-S2, no murmur, rub or gallop, no bruits, peripheral pulses normal and symmetric, no cyanosis, clubbing, edema or varicosities GI: soft, non-tender; no hepatosplenomegaly, masses; active bowel sounds all quadrants GU: no hernia, testicular  mass, penile discharge Lymph: no cervical, axillary or inguinal adenopathy MSK: gait normal, muscle tone and strength WNL, no joint swelling, effusions, discoloration, crepitus  SKIN: clear, good turgor, color WNL, no rashes, lesions, or ulcerations Neuro: normal mental status, normal strength,  sensation, and motion Psych: alert; oriented to person, place and time, normally interactive and not anxious or depressed in appearance.  Assessment and Plan:  Routine general medical examination at a health care facility  Lesion of ulnar nerve, right  The patient's preventative maintenance and recommended screening tests for an annual wellness exam were reviewed in full today. Brought up to date unless services declined.  Counselled on the importance of diet, exercise, and its role in overall health and mortality. The patient's FH and SH was reviewed, including their home life, tobacco status, and drug and alcohol status.   Overall doing well. All labs reviewed.  Right-sided ulnar nerve difficulties continue to be a significant problem for him. He is contemplating another surgery. I brought up that potentially an ultrasound-guided hydrodissection could help free up any scar tissue around the nerve. I gave him the name of Iona Coach at [facility], who would be one of the people with the greatest expertise in [place].  Orders Today:  No orders of the defined types were placed in this encounter.    Updated Medication List: (Includes new medications, updates to list, dose adjustments) Meds ordered this encounter  Medications  . DISCONTD: tadalafil (CIALIS) 20 MG tablet    Sig: Take 20 mg by mouth daily as needed for erectile dysfunction.  . tadalafil (CIALIS) 20 MG tablet    Sig: Take 1 tablet (20 mg total) by mouth daily as needed for erectile dysfunction.    Dispense:  10 tablet    Refill:  1    Medications Discontinued: Medications Discontinued During This Encounter  Medication Reason  .  escitalopram (LEXAPRO) 10 MG tablet Error  . zolpidem (AMBIEN) 10 MG tablet Error  . tadalafil (CIALIS) 20 MG tablet Error  . tadalafil (CIALIS) 20 MG tablet Reorder      Signed, Karleen Hampshire T. Copland, MD 07/14/2012 3:08 PM

## 2012-08-03 ENCOUNTER — Telehealth: Payer: Self-pay

## 2012-08-03 NOTE — Telephone Encounter (Signed)
Pt has appt with neck surgeon at [facility] in a few weeks and pt request copies of 2 MRI's Dr Patsy Lager ordered 1-2 years ago.Please advise.

## 2012-08-05 NOTE — Telephone Encounter (Signed)
Matt called and also requested that he receive the Radiology reports.  Faxed him a medicat release form which he will fax back 08/06/12 in am. Please call when ready for pickup.   Faxed to [phone]. Best Number for Susy Frizzle is [phone]

## 2012-10-12 ENCOUNTER — Other Ambulatory Visit: Payer: Self-pay | Admitting: *Deleted

## 2012-10-12 MED ORDER — SIMVASTATIN 20 MG PO TABS: 20.0000 mg | ORAL_TABLET | Freq: Every day | ORAL | Status: DC

## 2012-10-12 NOTE — Telephone Encounter (Signed)
Received faxed refill request from pharmacy. Sent refill electronically.

## 2013-09-09 ENCOUNTER — Encounter: Payer: Self-pay | Admitting: Cardiovascular Disease

## 2013-09-09 ENCOUNTER — Ambulatory Visit (INDEPENDENT_AMBULATORY_CARE_PROVIDER_SITE_OTHER): Payer: BC Managed Care – PPO | Admitting: Cardiovascular Disease

## 2013-09-09 ENCOUNTER — Encounter (INDEPENDENT_AMBULATORY_CARE_PROVIDER_SITE_OTHER): Payer: Self-pay

## 2013-09-09 VITALS — BP 122/92 | HR 61 | Ht 67.0 in | Wt 191.5 lb

## 2013-09-09 DIAGNOSIS — I10 Essential (primary) hypertension: Secondary | ICD-10-CM

## 2013-09-09 DIAGNOSIS — R002 Palpitations: Secondary | ICD-10-CM

## 2013-09-09 DIAGNOSIS — Z0000 Encounter for general adult medical examination without abnormal findings: Secondary | ICD-10-CM

## 2013-09-09 DIAGNOSIS — E785 Hyperlipidemia, unspecified: Secondary | ICD-10-CM

## 2013-09-09 MED ORDER — ROSUVASTATIN CALCIUM 10 MG PO TABS: 10.0000 mg | ORAL_TABLET | Freq: Every day | ORAL | Status: DC

## 2013-09-09 NOTE — Progress Notes (Signed)
   Patient ID: Jesse Horn, Jesse Horn, male    DOB: Apr 21, 1971, 43 y.o.   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  He reports having significant elbow and other arthritic issues from long history of weightlifting. He reports having a very stressful several years, details not available. He does not check his blood pressure at home. In doctor's offices, blood pressure is always borderline elevated sometimes with systolic more than 140, diastolic more than 90. Again no blood pressures from home.  He has not been on medications in the past for blood pressure.  He does report a family history of high cholesterol. His recent total cholesterol with no medication was 262, LDL 190. Was previously on simvastatin 20 mg daily with no side effects. He stopped the medication in an effort to try to control his cholesterol with weight loss and diet. This was unsuccessful by his recent numbers.  EKG shows normal sinus rhythm with rate 61 beats a minute, no significant ST or T wave changes  Outpatient Encounter Prescriptions as of 09/09/2013  Medication Sig  . tadalafil (CIALIS) 20 MG tablet Take 1 tablet (20 mg total) by mouth daily as needed for erectile dysfunction.  . [DISCONTINUED] simvastatin (ZOCOR) 20 MG tablet Take 1 tablet (20 mg total) by mouth daily.    Review of Systems  Constitutional: Negative.   HENT: Negative.   Eyes: Negative.   Respiratory: Negative.   Cardiovascular: Negative.   Gastrointestinal: Negative.   Endocrine: Negative.   Musculoskeletal: Positive for arthralgias.  Skin: Negative.   Allergic/Immunologic: Negative.   Neurological: Negative.   Hematological: Negative.   Psychiatric/Behavioral: Negative.   All other systems reviewed and are negative.   BP 122/92  Pulse 61  Ht 5\' 7"  (1.702 m)  Wt 191 lb 8 oz (86.864 kg)  BMI 29.99  kg/m2  Physical Exam  Nursing note and vitals reviewed. Constitutional: He is oriented to person, place, and time. He appears well-developed and well-nourished.  HENT:  Head: Normocephalic.  Nose: Nose normal.  Mouth/Throat: Oropharynx is clear and moist.  Eyes: Conjunctivae are normal. Pupils are equal, round, and reactive to light.  Neck: Normal range of motion. Neck supple. No JVD present.  Cardiovascular: Normal rate, regular rhythm, S1 normal, S2 normal, normal heart sounds and intact distal pulses.  Exam reveals no gallop and no friction rub.   No murmur heard. Pulmonary/Chest: Effort normal and breath sounds normal. No respiratory distress. He has no wheezes. He has no rales. He exhibits no tenderness.  Abdominal: Soft. Bowel sounds are normal. He exhibits no distension. There is no tenderness.  Musculoskeletal: Normal range of motion. He exhibits no edema and no tenderness.  Lymphadenopathy:    He has no cervical adenopathy.  Neurological: He is alert and oriented to person, place, and time. Coordination normal.  Skin: Skin is warm and dry. No rash noted. No erythema.  Psychiatric: He has a normal mood and affect. His behavior is normal. Judgment and thought content normal.      Assessment and Plan

## 2013-09-09 NOTE — Assessment & Plan Note (Signed)
After long discussion about coronary artery disease, hyperlipidemia, risk factors, he is interested in a calcium score. We will schedule him for a CT calcium score at his convenience to help guide his lipid management.

## 2013-09-09 NOTE — Assessment & Plan Note (Signed)
A long discussion about his cholesterol. It is very high despite his efforts to lose weight, eat healthy. We have suggested he start Crestor 5 mg daily for several weeks with titration up to 10 mg daily. We will check his cholesterol in 3 months time with LFTs

## 2013-09-09 NOTE — Assessment & Plan Note (Signed)
We have recommended that he buy a blood pressure cuff and check his blood pressure numbers at home. They are borderline in the office. I suspect that he may be better controlled at home. As he is nondiabetic, goal blood pressure less than 140 systolic, less than 90 diastolic

## 2013-09-09 NOTE — Patient Instructions (Addendum)
You are doing well. Please start crestor 5 mg daily for a few weeks, Then increase up to 10 mg  Cholesterol and liver labs at the beginning of August 2015  Please monitor your blood pressure at home  [facility] a [facility]:  Friday, May 15 @ 8:30, please arrive at 8:15am There is a cash fee of $150.00 for this test due on day of appt.  Please call us if you have new issues that need to be addressed before your next appt.  Follow up in 6 months

## 2013-09-16 ENCOUNTER — Ambulatory Visit (INDEPENDENT_AMBULATORY_CARE_PROVIDER_SITE_OTHER)
Admission: RE | Admit: 2013-09-16 | Discharge: 2013-09-16 | Disposition: A | Discharge status: Home / Self Care | Payer: BC Managed Care – PPO | Source: Ambulatory Visit | Attending: Cardiovascular Disease | Admitting: Cardiovascular Disease

## 2013-09-16 DIAGNOSIS — R002 Palpitations: Secondary | ICD-10-CM

## 2013-09-20 ENCOUNTER — Telehealth: Payer: Self-pay | Admitting: *Deleted

## 2013-09-20 NOTE — Telephone Encounter (Signed)
Dr. Axel Fillerausey called regarding results of his heart scan, please call

## 2013-09-21 ENCOUNTER — Encounter: Payer: Self-pay | Admitting: Cardiovascular Disease

## 2013-09-21 NOTE — Telephone Encounter (Signed)
Ca score is zero This is very good.No significant build up Would probably continue crestor 5 to 10 as tolerated, for preventive Recheck cholesterol in a few months

## 2013-09-21 NOTE — Telephone Encounter (Signed)
Spoke w/ pt.  He is aware of results.  He would like to personally speak to Dr. Mariah MillingGollan about not being on any type of chol med.

## 2013-09-22 NOTE — Telephone Encounter (Signed)
We have talked He will not start a statin at this time given A score of 0 Will repeat scan in 2 years

## 2013-09-27 ENCOUNTER — Other Ambulatory Visit: Payer: Self-pay

## 2013-12-16 ENCOUNTER — Other Ambulatory Visit: Payer: BC Managed Care – PPO

## 2014-11-27 IMAGING — CT CT HEART SCORING
1 of 3 series · 10 of 20 positions shown, 13 images · non-contrast
Comparison: Chest CT 01/05/2008

CLINICAL DATA: Risk stratification

EXAM:
Coronary Calcium Score
TECHNIQUE: The patient was scanned on a Siemens Sensation 16 slice scanner.
Axial non-contrast 3mm slices were carried out through the heart.
The data set was analyzed on a dedicated work station and scored
using the Agatson method.

[Series 6: st thins for reformat · axial · 0.66mm/px · z∈[-193,-101]mm · 10 of 114 slices shown, 13 images]
[im 11/114  vessel]
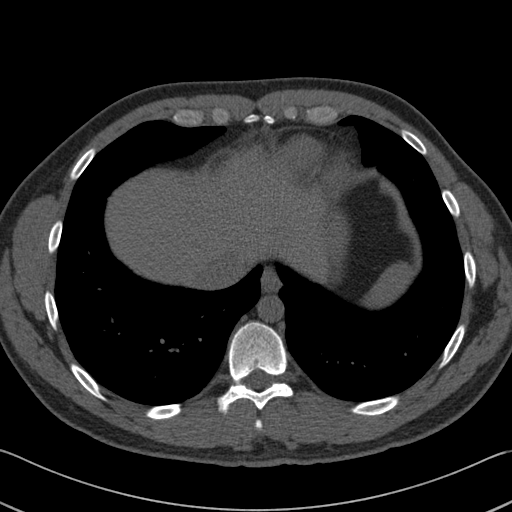
[im 11/114  lung]
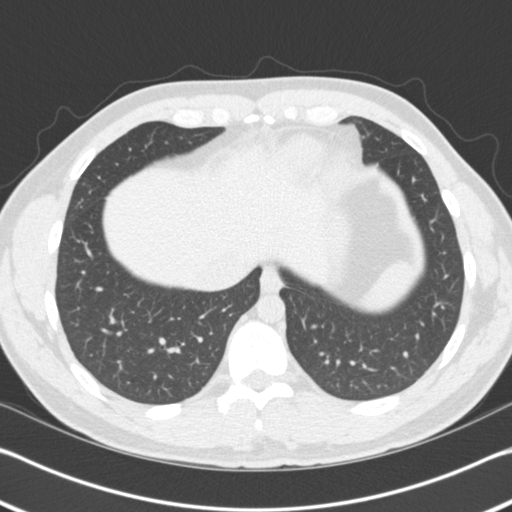
[im 21/114  vessel]
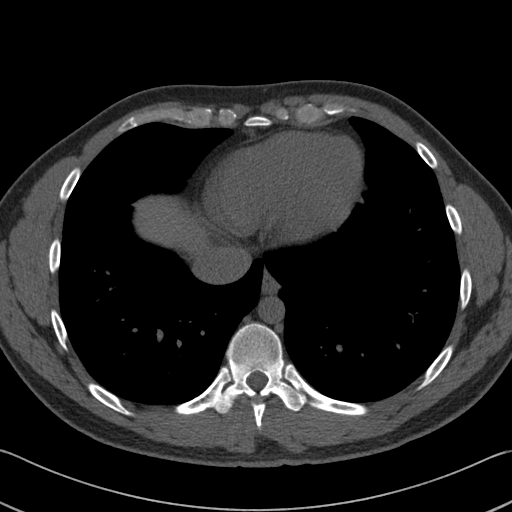
[im 31/114  vessel]
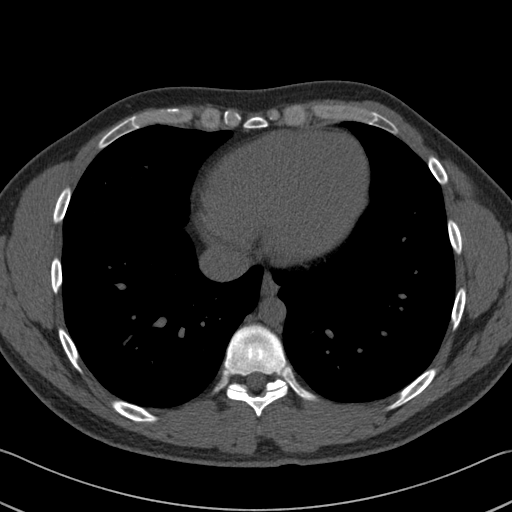
[im 42/114  vessel]
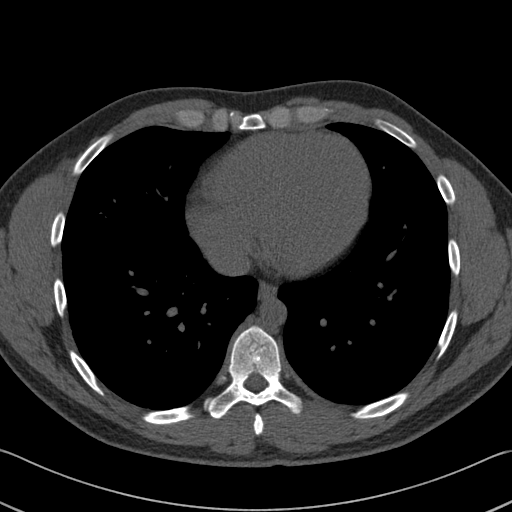
[im 52/114  vessel]
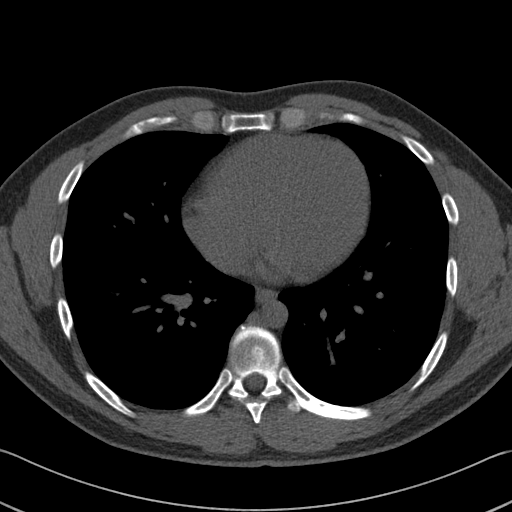
[im 52/114  lung]
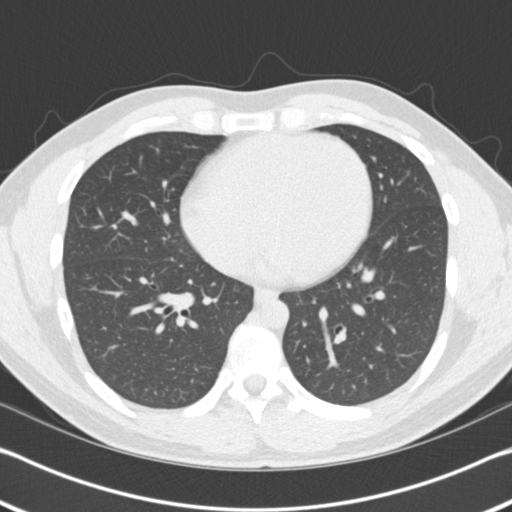
[im 62/114  vessel]
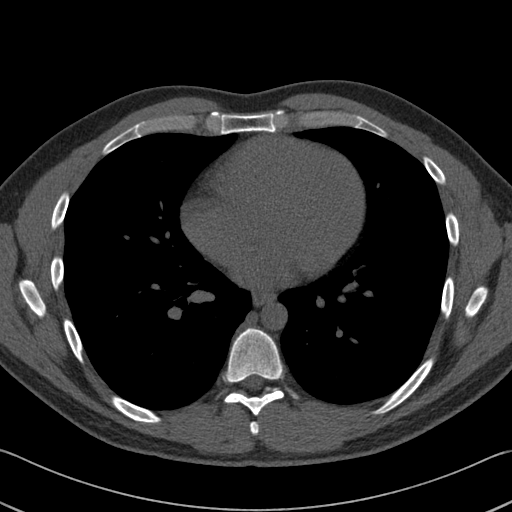
[im 72/114  vessel]
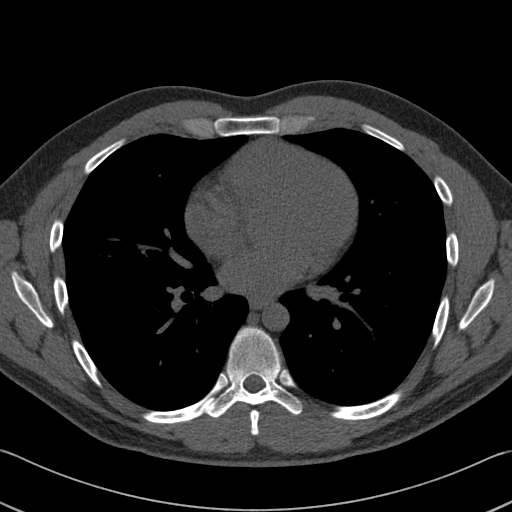
[im 83/114  vessel]
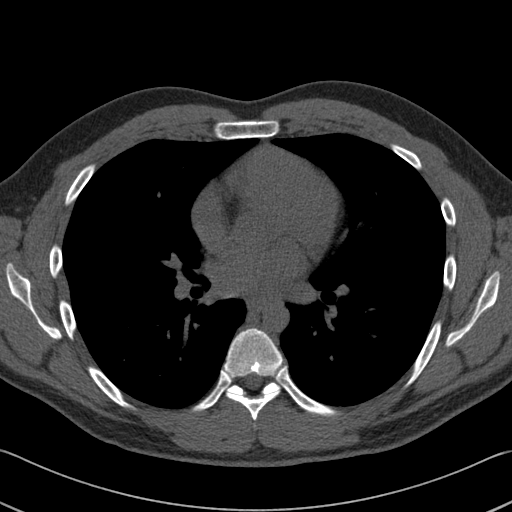
[im 93/114  vessel]
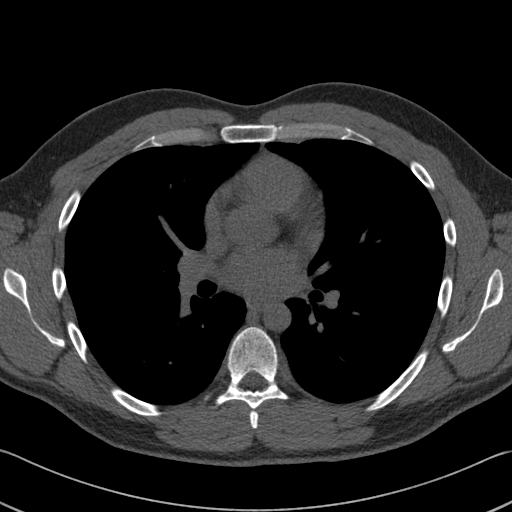
[im 93/114  lung]
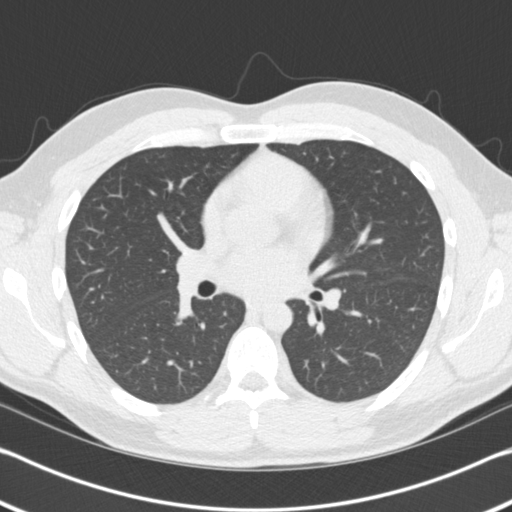
[im 103/114  vessel]
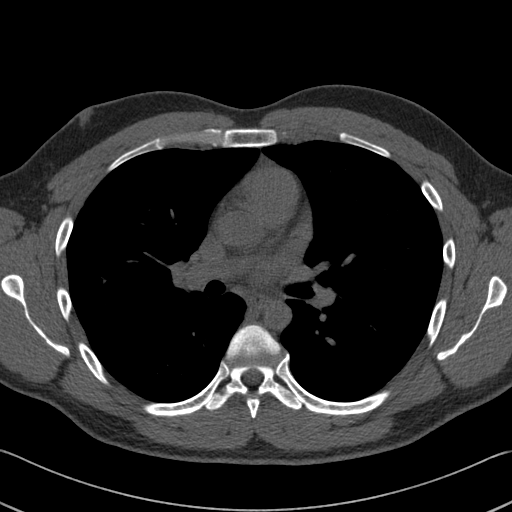

[10 of 20 positions shown; findings below may reference images not displayed]

FINDINGS: Non-cardiac: No significant non cardiac findings on limited lung and
soft tissue windows. See separate report from [REDACTED].

Ascending Aorta:  Normal size

Pericardium:  Normal, no pericardial effusion
IMPRESSION: Coronary calcium score of 0. This was 0 percentile for age and sex
matched control.

[place] Pseneac

EXAM:
OVER-READ INTERPRETATION  CT CHEST

The following report is an over-read performed by radiologist Dr.
over-read does not include interpretation of cardiac or coronary
anatomy or pathology. The coronary calcium score interpretation by
the cardiologist is attached.
FINDINGS: Within the visualized portions of the thorax there is no acute
consolidative airspace disease, no pneumothorax, no pleural effusion
and no suspicious appearing pulmonary nodule or mass. Visualized
portions of the upper abdomen are unremarkable. There are no
aggressive appearing lytic or blastic lesions noted in the
visualized portions of the skeleton.
IMPRESSION: 1. No significant incidental noncardiac findings.

## 2017-12-23 NOTE — Progress Notes (Signed)
Cardiology Office Note  Date:  12/24/2017   ID:  Jesse Horn, Jesse Horn, DOB [birth year], MRN [id number]  PCP:  Patient, No Pcp Per   Chief Complaint  Patient presents with  . other    Self referral elevated BP and cholesterol. Denies palpitations, chest pain or shortness of breath. Meds reviewed by the pt. verbally.     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 He has not been on medications in the past for blood pressure.  Previously on simvastatin for hyperlipidemia o recent lab work available Previous total cholesterol with no medication was 262, LDL 190.  CT coronary calcium score 0  EKG personally reviewed by myself on todays visit Shows normal sinus rhythm rate 73 bpm a significant ST or T-wave changes  PMH:   has a past medical history of GERD (gastroesophageal reflux disease), Hiatal hernia, and Hyperlipidemia.  PSH:    Past Surgical History:  Procedure Laterality Date  . ELBOW ARTHROSCOPY  2005   right  . ULNAR NERVE REPAIR     exploratory right, chronic ulnar nerve partial    Current Outpatient Medications  Medication Sig Dispense Refill  . Tadalafil (CIALIS PO) Take 3 mg by mouth daily.     No current facility-administered medications for this visit.      Allergies:   Sulfonamide derivatives   Social History:  The patient  reports that he has never smoked. He has never used smokeless tobacco. He reports that he drinks alcohol. He reports that he does not use drugs.   Family History:   family history includes Heart disease in his  maternal grandfather and paternal grandfather; Hyperlipidemia in his father, maternal grandmother, paternal grandmother, and paternal uncle; Hypertension in his father, maternal grandmother, paternal grandmother, and paternal uncle.    Review of Systems: Review of Systems  Constitutional: Negative.   Respiratory: Negative.   Cardiovascular: Negative.   Gastrointestinal: Negative.   Musculoskeletal: Negative.   Neurological: Negative.   Psychiatric/Behavioral: Negative.   All other systems reviewed and are negative.    PHYSICAL EXAM: VS:  BP (!) 142/78 (BP Location: Left Arm, Patient Position: Sitting, Cuff Size: Normal)   Pulse 72   Ht 5\' 7"  (1.702 m)   Wt 210 lb 4 oz (95.4 kg)   BMI 32.93 kg/m  , BMI Body mass index is 32.93 kg/m. GEN: Well nourished, well developed, in no acute distress  HEENT: normal  Neck: no JVD, carotid bruits, or masses Cardiac: RRR; no murmurs, rubs, or gallops,no edema  Respiratory:  clear to auscultation bilaterally, normal work of breathing GI: soft, nontender, nondistended, + BS MS: no deformity or atrophy  Skin: warm and dry, no rash Neuro:  Strength and sensation are intact Psych: euthymic mood, full affect   Recent Labs: No results found for requested labs within last 8760 hours.    Lipid Panel Lab Results  Component Value Date   CHOL 184 04/02/2011   HDL 52.40 04/02/2011   LDLCALC 104 (H) 04/02/2011   TRIG 140.0 04/02/2011      Wt Readings from Last 3  Encounters:  12/24/17 210 lb 4 oz (95.4 kg)  09/09/13 191 lb 8 oz (86.9 kg)  07/14/12 188 lb 12 oz (85.6 kg)       ASSESSMENT AND PLAN:  Essential hypertension - Plan: EKG 12-Lead Recommended he monitor blood pressure at home before we start any medication Improved number on today's visit Possibly related to weight gain and stress  Mixed hyperlipidemia - Plan: EKG 12-Lead, Lipid panel We have drawn a lipid panel today in the office CT chronic calcium score 0 several  years ago Could consider aggressive management of his cholesterol if he wishes  Palpitations Denies any recent palpitations  Disposition:   F/U as needed   Total encounter time more than 45 minutes  Greater than 50% was spent in counseling and coordination of care with the patient    Orders Placed This Encounter  Procedures  . Lipid panel  . EKG 12-Lead     Signed, Jesse Horn, M.D., Ph.D. 12/24/2017  [facility] [place] [phone]

## 2017-12-24 ENCOUNTER — Encounter: Payer: Self-pay | Admitting: Cardiovascular Disease

## 2017-12-24 ENCOUNTER — Ambulatory Visit: Payer: BC Managed Care – PPO | Admitting: Cardiovascular Disease

## 2017-12-24 VITALS — BP 142/78 | HR 72 | Ht 67.0 in | Wt 210.2 lb

## 2017-12-24 DIAGNOSIS — I10 Essential (primary) hypertension: Secondary | ICD-10-CM | POA: Diagnosis not present

## 2017-12-24 DIAGNOSIS — E782 Mixed hyperlipidemia: Secondary | ICD-10-CM | POA: Diagnosis not present

## 2017-12-24 DIAGNOSIS — R002 Palpitations: Secondary | ICD-10-CM

## 2017-12-24 NOTE — Patient Instructions (Addendum)
Monitor blood pressure   Medication Instructions:   No medication changes made  Labwork:  Lipids today (fasting)  Testing/Procedures:  No further testing at this time   Follow-Up: It was a pleasure seeing you in the office today. Please call us if you have new issues that need to be addressed before your next appt.  [phone]  Your physician wants you to follow-up in:  As needed  If you need a refill on your cardiac medications before your next appointment, please call your pharmacy.  For educational health videos Log in to : [email or web address] Or : [email or web address], password : [email or web address]

## 2017-12-25 ENCOUNTER — Other Ambulatory Visit: Payer: Self-pay | Admitting: Cardiovascular Disease

## 2017-12-25 LAB — LIPID PANEL
CHOLESTEROL TOTAL: 259 mg/dL — AB (ref 100–199)
Chol/HDL Ratio: 5.1 ratio — ABNORMAL HIGH (ref 0.0–5.0)
HDL: 51 mg/dL (ref >39)
LDL Calculated: 178 mg/dL — ABNORMAL HIGH (ref 0–99)
Triglycerides: 151 mg/dL — ABNORMAL HIGH (ref 0–149)
VLDL CHOLESTEROL CAL: 30 mg/dL (ref 5–40)

## 2017-12-25 MED ORDER — ROSUVASTATIN CALCIUM 10 MG PO TABS: 10.0000 mg | ORAL_TABLET | Freq: Every day | ORAL | 3 refills | Status: DC

## 2017-12-28 ENCOUNTER — Other Ambulatory Visit: Payer: Self-pay | Admitting: *Deleted

## 2017-12-28 MED ORDER — ROSUVASTATIN CALCIUM 10 MG PO TABS: 10.0000 mg | ORAL_TABLET | Freq: Every day | ORAL | 3 refills | Status: DC

## 2019-02-23 ENCOUNTER — Other Ambulatory Visit: Payer: Self-pay | Admitting: Cardiovascular Disease

## 2019-02-23 NOTE — Telephone Encounter (Signed)
Pt overdue for 12 month f/u. °Please contact pt for future appointment. °

## 2019-03-01 ENCOUNTER — Other Ambulatory Visit: Payer: Self-pay

## 2019-03-01 NOTE — Telephone Encounter (Signed)
Pt due for 12 month f/u. Please contact pt for future appointment. 

## 2019-03-01 NOTE — Telephone Encounter (Signed)
Overdue for 12 mo F/U. Please contact to schedule appointment for refills. Thank you!

## 2019-03-01 NOTE — Telephone Encounter (Signed)
*  STAT* If patient is at the pharmacy, call can be transferred to refill team.   1. Which medications need to be refilled? (please list name of each medication and dose if known) Crestor  2. Which pharmacy/location (including street and city if local pharmacy) is medication to be sent to? [facility] 3. Do they need a 30 day or 90 day supply? [place]

## 2019-03-03 ENCOUNTER — Telehealth: Payer: Self-pay | Admitting: Cardiovascular Disease

## 2019-03-03 MED ORDER — ROSUVASTATIN CALCIUM 10 MG PO TABS: 10.0000 mg | ORAL_TABLET | Freq: Every day | ORAL | 0 refills | Status: DC

## 2019-03-03 NOTE — Telephone Encounter (Signed)
Virtual Visit Pre-Appointment Phone Call  "(Name), I am calling you today to discuss your upcoming appointment. We are currently trying to limit exposure to the virus that causes COVID-19 by seeing patients at home rather than in the office."  1. "What is the BEST phone number to call the day of the visit?" - include this in appointment notes  2. Do you have or have access to (through a family member/friend) a smartphone with video capability that we can use for your visit?" a. If yes - list this number in appt notes as cell (if different from BEST phone #) and list the appointment type as a VIDEO visit in appointment notes b. If no - list the appointment type as a PHONE visit in appointment notes  3. Confirm consent - "In the setting of the current Covid19 crisis, you are scheduled for a (phone or video) visit with your provider on (date) at (time).  Just as we do with many in-office visits, in order for you to participate in this visit, we must obtain consent.  If you'd like, I can send this to your mychart (if signed up) or email for you to review.  Otherwise, I can obtain your verbal consent now.  All virtual visits are billed to your insurance company just like a normal visit would be.  By agreeing to a virtual visit, we'd like you to understand that the technology does not allow for your provider to perform an examination, and thus may limit your provider's ability to fully assess your condition. If your provider identifies any concerns that need to be evaluated in person, we will make arrangements to do so.  Finally, though the technology is pretty good, we cannot assure that it will always work on either your or our end, and in the setting of a video visit, we may have to convert it to a phone-only visit.  In either situation, we cannot ensure that we have a secure connection.  Are you willing to proceed?" STAFF: Did the patient verbally acknowledge consent to telehealth visit? Document  YES/NO here: YES  4. Advise patient to be prepared - "Two hours prior to your appointment, go ahead and check your blood pressure, pulse, oxygen saturation, and your weight (if you have the equipment to check those) and write them all down. When your visit starts, your provider will ask you for this information. If you have an Apple Watch or Kardia device, please plan to have heart rate information ready on the day of your appointment. Please have a pen and paper handy nearby the day of the visit as well."  5. Give patient instructions for MyChart download to smartphone OR Doximity/Doxy.me as below if video visit (depending on what platform provider is using)  6. Inform patient they will receive a phone call 15 minutes prior to their appointment time (may be from unknown caller ID) so they should be prepared to answer    TELEPHONE CALL NOTE  Jesse MainlandMatthew C Bossler, Jesse Horn has been deemed a candidate for a follow-up tele-health visit to limit community exposure during the Covid-19 pandemic. I spoke with the patient via phone to ensure availability of phone/video source, confirm preferred email & phone number, and discuss instructions and expectations.  I reminded Jesse Horn, Jesse Horn to be prepared with any vital sign and/or heart rhythm information that could potentially be obtained via home monitoring, at the time of his visit. I reminded Jesse MainlandMatthew C Oros, Jesse Horn to expect a phone  call prior to his visit.  Jesse Horn 03/03/2019 1:53 PM   INSTRUCTIONS FOR DOWNLOADING THE MYCHART APP TO SMARTPHONE  - The patient must first make sure to have activated MyChart and know their login information - If Apple, go to [employer] and type in MyChart in the search bar and download the app. If Android, ask patient to go to [employer] and type in [place] in the search bar and download the app. The app is free but as with any other app downloads, their phone may require them to verify saved payment  information or Apple/Android password.  - The patient will need to then log into the app with their MyChart username and password, and select [facility] as their healthcare provider to link the account. When it is time for your visit, go to the MyChart app, find appointments, and click Begin Video Visit. Be sure to Select Allow for your device to access the Microphone and Camera for your visit. You will then be connected, and your provider will be with you shortly.  **If they have any issues connecting, or need assistance please contact MyChart service desk [phone])**  **If using a computer, in order to ensure the best quality for their visit they will need to use either of the following Internet Browsers: [employer] or Google Chrome**  IF USING DOXIMITY or DOXY.ME - The patient will receive a link just prior to their visit by text.     FULL LENGTH CONSENT FOR TELE-HEALTH VISIT   I hereby voluntarily request, consent and authorize [facility] and its employed or contracted physicians, physician assistants, nurse practitioners or other licensed health care professionals (the Practitioner), to provide me with telemedicine health care services (the Services") as deemed necessary by the treating Practitioner. I acknowledge and consent to receive the Services by the Practitioner via telemedicine. I understand that the telemedicine visit will involve communicating with the Practitioner through live audiovisual communication technology and the disclosure of certain medical information by electronic transmission. I acknowledge that I have been given the opportunity to request an in-person assessment or other available alternative prior to the telemedicine visit and am voluntarily participating in the telemedicine visit.  I understand that I have the right to withhold or withdraw my consent to the use of telemedicine in the course of my care at any time, without affecting my right  to future care or treatment, and that the Practitioner or I may terminate the telemedicine visit at any time. I understand that I have the right to inspect all information obtained and/or recorded in the course of the telemedicine visit and may receive copies of available information for a reasonable fee.  I understand that some of the potential risks of receiving the Services via telemedicine include:   Delay or interruption in medical evaluation due to technological equipment failure or disruption;  Information transmitted may not be sufficient (e.g. poor resolution of images) to allow for appropriate medical decision making by the Practitioner; and/or   In rare instances, security protocols could fail, causing a breach of personal health information.  Furthermore, I acknowledge that it is my responsibility to provide information about my medical history, conditions and care that is complete and accurate to the best of my ability. I acknowledge that Practitioner's advice, recommendations, and/or decision may be based on factors not within their control, such as incomplete or inaccurate data provided by me or distortions of diagnostic images or specimens that may result from electronic  transmissions. I understand that the practice of medicine is not an exact science and that Practitioner makes no warranties or guarantees regarding treatment outcomes. I acknowledge that I will receive a copy of this consent concurrently upon execution via email to the email address I last provided but may also request a printed copy by calling the office of [facility].    I understand that my insurance will be billed for this visit.   I have read or had this consent read to me.  I understand the contents of this consent, which adequately explains the benefits and risks of the Services being provided via telemedicine.   I have been provided ample opportunity to ask questions regarding this consent and the Services  and have had my questions answered to my satisfaction.  I give my informed consent for the services to be provided through the use of telemedicine in my medical care  By participating in this telemedicine visit I agree to the above.

## 2019-03-03 NOTE — Telephone Encounter (Signed)
Patient scheduled virtual follow up visit  Please refill medication

## 2019-03-15 NOTE — Progress Notes (Signed)
Virtual Visit via Video Note   This visit type was conducted due to national recommendations for restrictions regarding the COVID-19 Pandemic (e.g. social distancing) in an effort to limit this patient's exposure and mitigate transmission in our community.  Due to his co-morbid illnesses, this patient is at least at moderate risk for complications without adequate follow up.  This format is felt to be most appropriate for this patient at this time.  All issues noted in this document were discussed and addressed.  A limited physical exam was performed with this format.  Please refer to the patient's chart for his consent to telehealth for [facility].   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 I discussed the limitations of evaluation and management by telemedicine. The patient expressed understanding and agreed to proceed.   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 other    12 month follow up. Meds reviewed by the pt. verbally. Pt. c/o palpitations last week but not very often. 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   The patient does not symptoms concerning for COVID-19 infection (fever, chills, cough, or new SHORTNESS OF BREATH).   Patient has a past medical history of borderline hypertension,  hyperlipidemia  CT coronary calcium score 2015 was zero who presents for f/u of his palpitations, hyperlipidemia, high blood  pressure  Working long hours, dental school Stressful, with covid Went back to school, getting a masters  BP fine recently  Poor sleep, likely from stress Trying to lose weight, exercise, Decreasing TV to help with sleep Trying sleep aides.  Not much luck Tried melatonin, ambien , wakes early  Daughter is 55 1/2 yr old  Previously mentioned having elbow and other arthritic issues from long history of weightlifting.  been on medications in the past for blood pressure.  Previously on simvastatin for hyperlipidemia  total cholesterol with no medication was 262, LDL 190. Numbers better on Crestor 10 previous LDL 107  CT coronary calcium score 0 in  2015   Prior CV studies:   The following studies were reviewed today:   Past Medical History:  Diagnosis Date  . GERD (gastroesophageal reflux disease)   . Hiatal hernia   . Hyperlipidemia    Past Surgical History:  Procedure Laterality Date  . ELBOW ARTHROSCOPY  2005   right  . ULNAR NERVE REPAIR     exploratory right, chronic ulnar nerve partial     Allergies:   Sulfonamide derivatives   Social History   Tobacco Use  . Smoking status: Never Smoker  . Smokeless tobacco: Never Used  Substance Use Topics  . Alcohol use: Yes    Comment: Drinks 3 beer weekly.   . Drug use: No     Current Outpatient Medications on File Prior to Visit  Medication Sig Dispense Refill  . rosuvastatin (CRESTOR) 10 MG tablet Take 1  tablet (10 mg total) by mouth daily. 90 tablet 0  . Tadalafil (CIALIS PO) Take 3 mg by mouth daily.     No current facility-administered medications on file prior to visit.      Family Hx: The patient's family history includes Heart disease in his maternal grandfather and paternal grandfather; Hyperlipidemia in his father, maternal grandmother, paternal grandmother, and paternal uncle; Hypertension in his father, maternal grandmother, paternal grandmother, and paternal uncle.  ROS:   Please see the  history of present illness.    Review of Systems  Constitutional: Negative.   Respiratory: Negative.   Cardiovascular: Negative.   Gastrointestinal: Negative.   Musculoskeletal: Negative.   Neurological: Negative.   Psychiatric/Behavioral: Negative.   All other systems reviewed and are negative.    Labs/Other Tests and Data Reviewed:    Recent Labs: No results found for requested labs within last 8760 hours.   Recent Lipid Panel Lab Results  Component Value Date/Time   CHOL 259 (H) 12/24/2017 10:17 AM   TRIG 151 (H) 12/24/2017 10:17 AM   HDL 51 12/24/2017 10:17 AM   CHOLHDL 5.1 (H) 12/24/2017 10:17 AM   CHOLHDL 4 04/02/2011 07:53 AM   LDLCALC 178 (H) 12/24/2017 10:17 AM   LDLDIRECT 97.8 06/28/2012 01:00 PM    Wt Readings from Last 3 Encounters:  12/24/17 210 lb 4 oz (95.4 kg)  09/09/13 191 lb 8 oz (86.9 kg)  07/14/12 188 lb 12 oz (85.6 kg)     Exam:    Vital Signs: Vital signs may also be detailed in the HPI There were no vitals taken for this visit.  Wt Readings from Last 3 Encounters:  12/24/17 210 lb 4 oz (95.4 kg)  09/09/13 191 lb 8 oz (86.9 kg)  07/14/12 188 lb 12 oz (85.6 kg)   Temp Readings from Last 3 Encounters:  07/14/12 98.3 F (36.8 C) (Oral)  04/07/11 98.2 F (36.8 C) (Oral)   BP Readings from Last 3 Encounters:  12/24/17 (!) 142/78  09/09/13 (!) 122/92  07/14/12 120/78   Pulse Readings from Last 3 Encounters:  12/24/17 72  09/09/13 61  07/14/12 70     Well nourished, well developed male in no acute distress. Constitutional:  oriented to person, place, and time. No distress.    ASSESSMENT & PLAN:    Problem List Items Addressed This Visit      Cardiology Problems   Hyperlipidemia   Essential hypertension - Primary    Other Visit Diagnoses    Palpitations         Essential hypertension -  Recommend he continue exercise program, monitor blood pressure at home   Mixed hyperlipidemia - Orders placed for lab work CT chronic  calcium score 0 in 2015 We will aim for goal LDL 100 or less High numbers likely familial  Palpitations Denies any recent palpitations    COVID-19 Education: The signs and symptoms of COVID-19 were discussed with the patient and how to seek care for testing (follow up with PCP or arrange E-visit).  The importance of social distancing was discussed today.  Patient Risk:   After full review of this patients clinical status, I feel that they are at least moderate risk at this time.  Time:   Today, I have spent 25 minutes with the patient with telehealth technology discussing the cardiac and medical problems/diagnoses detailed above   Additional 10 min spent reviewing the chart prior to patient visit today   Medication Adjustments/Labs and Tests Ordered: Current  medicines are reviewed at length with the patient today.  Concerns regarding medicines are outlined above.   Tests Ordered: No tests ordered   Medication Changes: No changes made   Disposition: Follow-up in 12 months   Signed, Julien Nordmannimothy Gollan, MD  [facility] [place] [facility] [address], [place], [place] [address]

## 2019-03-16 ENCOUNTER — Telehealth (INDEPENDENT_AMBULATORY_CARE_PROVIDER_SITE_OTHER): Payer: BC Managed Care – PPO | Admitting: Cardiovascular Disease

## 2019-03-16 ENCOUNTER — Encounter: Payer: Self-pay | Admitting: Cardiovascular Disease

## 2019-03-16 ENCOUNTER — Other Ambulatory Visit: Payer: Self-pay

## 2019-03-16 VITALS — BP 131/72 | HR 83 | Ht 67.0 in | Wt 192.0 lb

## 2019-03-16 DIAGNOSIS — I10 Essential (primary) hypertension: Secondary | ICD-10-CM | POA: Diagnosis not present

## 2019-03-16 DIAGNOSIS — E782 Mixed hyperlipidemia: Secondary | ICD-10-CM | POA: Diagnosis not present

## 2019-03-16 DIAGNOSIS — R002 Palpitations: Secondary | ICD-10-CM

## 2019-03-16 MED ORDER — ROSUVASTATIN CALCIUM 10 MG PO TABS: 10.0000 mg | ORAL_TABLET | Freq: Every day | ORAL | 5 refills | Status: DC

## 2019-03-16 NOTE — Patient Instructions (Addendum)
  Lab work: Place an order lipids, LFT, [facility], send in the mail to his house    Medication Instructions:  No changes  If you need a refill on your cardiac medications before your next appointment, please call your pharmacy.     If you have labs (blood work) drawn today and your tests are completely normal, you will receive your results only by: Marland Kitchen MyChart Message (if you have MyChart) OR . A paper copy in the mail If you have any lab test that is abnormal or we need to change your treatment, we will call you to review the results.   Testing/Procedures: No new testing needed   Follow-Up: At [facility], you and your health needs are our priority.  As part of our continuing mission to provide you with exceptional heart care, we have created designated Provider Care Teams.  These Care Teams include your primary Cardiologist (physician) and Advanced Practice Providers (APPs -  Physician Assistants and Nurse Practitioners) who all work together to provide you with the care you need, when you need it.  . You will need a follow up appointment in 12 months .   Please call our office 2 months in advance to schedule this appointment.    . Providers on your designated Care Team:   . Murray Hodgkins, NP . Christell Faith, PA-C . Marrianne Mood, PA-C  Any Other Special Instructions Will Be Listed Below (If Applicable).  For educational health videos Log in to : [email or web address] Or : [email or web address], password : [email or web address]

## 2020-03-19 ENCOUNTER — Ambulatory Visit: Payer: BC Managed Care – PPO | Admitting: Family

## 2020-03-28 ENCOUNTER — Ambulatory Visit: Payer: BC Managed Care – PPO | Admitting: Nurse Practitioner

## 2020-04-17 ENCOUNTER — Other Ambulatory Visit: Payer: Self-pay

## 2020-04-17 ENCOUNTER — Ambulatory Visit: Payer: BC Managed Care – PPO | Admitting: Family

## 2020-04-17 ENCOUNTER — Encounter: Payer: Self-pay | Admitting: Family

## 2020-04-17 VITALS — BP 160/74 | HR 63 | Ht 67.0 in | Wt 209.0 lb

## 2020-04-17 DIAGNOSIS — Z131 Encounter for screening for diabetes mellitus: Secondary | ICD-10-CM

## 2020-04-17 DIAGNOSIS — I10 Essential (primary) hypertension: Secondary | ICD-10-CM | POA: Diagnosis not present

## 2020-04-17 DIAGNOSIS — E782 Mixed hyperlipidemia: Secondary | ICD-10-CM | POA: Diagnosis not present

## 2020-04-17 DIAGNOSIS — R002 Palpitations: Secondary | ICD-10-CM

## 2020-04-17 DIAGNOSIS — Z79899 Other long term (current) drug therapy: Secondary | ICD-10-CM

## 2020-04-17 DIAGNOSIS — Z13220 Encounter for screening for lipoid disorders: Secondary | ICD-10-CM

## 2020-04-17 MED ORDER — LOSARTAN POTASSIUM 25 MG PO TABS: 25.0000 mg | ORAL_TABLET | Freq: Every day | ORAL | 1 refills | Status: DC

## 2020-04-17 MED ORDER — ROSUVASTATIN CALCIUM 10 MG PO TABS: 10.0000 mg | ORAL_TABLET | Freq: Every day | ORAL | 1 refills | Status: DC

## 2020-04-17 NOTE — Progress Notes (Signed)
Office Visit    Patient Name: Jesse Horn, Jesse Horn Date of Encounter: 04/17/2020  Primary Care Provider:  Patient, No Pcp Per Primary Cardiologist:  Julien Nordmann, MD Electrophysiologist:  None   Chief Complaint    Jesse Horn, Jesse Horn is a 49 y.o. male with a hx of HTN, HLD, palpitations, insomnia presents today for elevated blood pressure.    Past Medical History    Past Medical History:  Diagnosis Date  . GERD (gastroesophageal reflux disease)   . Hiatal hernia   . Hyperlipidemia    Past Surgical History:  Procedure Laterality Date  . ELBOW ARTHROSCOPY  2005   right  . ULNAR NERVE REPAIR     exploratory right, chronic ulnar nerve partial    Allergies  Allergies  Allergen Reactions  . Sulfonamide Derivatives     History of Present Illness    Jesse Horn, Jesse Horn is a 49 y.o. male with a hx of hypertension, hyperlipidemia, palpitations, insomnia last seen via telemedicine 03/16/19 by Dr. Mariah Milling.  He had a coronary calcium score in 2015 of 0.  He has previously had elevated blood pressure readings and that has been encouraged lifestyle changes and has never been on antihypertensive medication.  He went today to have a root canal done with a friend who is an Magazine features editor and his BP ranged from 169/110-190/120.  He reports monitoring blood pressure very intermittently over the last year at home with readings routinely greater than 130/80.  He does not have an automatic arm cuff at home and his wife also can check it manually.  He does have significant difficulties with sleep and takes prescribed sleep aid as needed.  He has never had a sleep study done.  Does not wake up feeling well rested.  He reports no chest pain, pressure, tightness.  Reports no shortness of breath at rest.  He endorses he gets dyspneic with more than usual physical activity.  He attributes this to being out of shape.  He has no formal exercise routine but is interested in starting one.  He has a  19.47-year-old daughter and is expecting another daughter soon.    Of note he lives near [facility] and works as a Education officer, community at [employer].  He has a primary care in [place] who he does not see very often.  He is considering establishing with primary care closer to home.  He is very motivated to improve his health.  EKGs/Labs/Other Studies Reviewed:   The following studies were reviewed today:  EKG:  EKG is  ordered today.  The ekg ordered today demonstrates NSR 63 bpm with no acute ST/T wave changes.  Recent Labs: No results found for requested labs within last 8760 hours.  Recent Lipid Panel    Component Value Date/Time   CHOL 259 (H) 12/24/2017 1017   TRIG 151 (H) 12/24/2017 1017   HDL 51 12/24/2017 1017   CHOLHDL 5.1 (H) 12/24/2017 1017   CHOLHDL 4 04/02/2011 0753   VLDL 28.0 04/02/2011 0753   LDLCALC 178 (H) 12/24/2017 1017   LDLDIRECT 97.8 06/28/2012 1300   Home Medications   Current Meds  Medication Sig  . tadalafil (CIALIS) 5 MG tablet Take 5 mg by mouth daily as needed for erectile dysfunction.  . [DISCONTINUED] rosuvastatin (CRESTOR) 10 MG tablet Take 1 tablet (10 mg total) by mouth daily.  . [DISCONTINUED] Tadalafil (CIALIS PO) Take 3 mg by mouth daily.    Review of Systems  Review of Systems  Constitutional: Negative for chills, fever and malaise/fatigue.  Cardiovascular: Positive for dyspnea on exertion. Negative for chest pain, leg swelling, near-syncope, orthopnea, palpitations and syncope.  Respiratory: Negative for cough, shortness of breath and wheezing.   Gastrointestinal: Negative for nausea and vomiting.  Neurological: Negative for dizziness, light-headedness and weakness.   All other systems reviewed and are otherwise negative except as noted above.  Physical Exam    VS:  BP (!) 160/74 (BP Location: Left Arm, Patient Position: Sitting, Cuff Size: Normal)   Pulse 63   Ht 5\' 7"  (1.702 m)   Wt 209 lb (94.8 kg)   BMI 32.73 kg/m   , BMI Body mass index is 32.73 kg/m.  Wt Readings from Last 3 Encounters:  04/17/20 209 lb (94.8 kg)  03/16/19 192 lb (87.1 kg)  12/24/17 210 lb 4 oz (95.4 kg)    GEN: Well nourished, overweight, well developed, in no acute distress. HEENT: normal. Neck: Supple, no JVD, or masses. Cardiac: RRR, no murmurs, rubs, or gallops. No clubbing, cyanosis, edema.  Radials/PT 2+ and equal bilaterally.  Respiratory:  Respirations regular and unlabored, clear to auscultation bilaterally. GI: Soft, nontender, nondistended. MS: No deformity or atrophy. Skin: Warm and dry, no rash. Neuro:  Strength and sensation are intact. Psych: Normal affect.  Assessment & Plan    1. Hypertension- BP elevated. Start Losartan 25mg  daily. CMP today and repeat BMP in 2 weeks. BP goal <130/80. He will monitor at home and keep a log. If BP not at goal will plan to increased Losartan to 50mg  daily. Future considerations also include addition of HCTZ. Recommend heart healthy diet and regular cardiovascular exercise.   2. Hyperlipidemia- Continue Crestor 10 mg daily, refill provided.  Lipid panel, CMP today.  3. Dyspnea on exertion - CBC, TSH today to rule out anemia or thyroid abnormality as contributory. EKG no acute changes, no chest pain, low suspicion angina - no indication for ischemic evaluation. No edema, orthopnea, PND - no indication for echo at this time. Likely etiology deconditioning.   4. Insomnia- Follows with primary care.  Uses prescribed sleep medication a few times per week. Discussed potential benefit of sleep study which he prefers to defer at this time, will discuss at follow up.   5. Overweight-and exercise encouraged.  Discussed the role that weight plays on his blood pressure control.  HbA1c today.  Disposition: Follow up in 2 week(s) virtual visit with Dr. 12/26/17 or APP   Signed, , NP 04/17/2020, 1:06 PM [facility]

## 2020-04-17 NOTE — Patient Instructions (Addendum)
Medication Instructions:  Your provider has recommended you make the following change in your medication:   START Losartan 25mg  one tablet daily  *If you need a refill on your cardiac medications before your next appointment, please call your pharmacy*  Lab Work: Your provider recommends that you return for lab work today: CMP, CBC, Hemoglobin A1c, lipid panel, TSH  Your provider recommends that you return for lab work in 2 weeks at for repeat BMP.  If you have labs (blood work) drawn today and your tests are completely normal, you will receive your results only by: [employer] MyChart Message (if you have MyChart) OR . A paper copy in the mail If you have any lab test that is abnormal or we need to change your treatment, we will call you to review the results.  Testing/Procedures: Your EKG today shows normal sinus 63 bpm.  Follow-Up: At [facility], you and your health needs are our priority.  As part of our continuing mission to provide you with exceptional heart care, we have created designated Provider Care Teams.  These Care Teams include your primary Cardiologist (physician) and Advanced Practice Providers (APPs -  Physician Assistants and Nurse Practitioners) who all work together to provide you with the care you need, when you need it.  We recommend signing up for the patient portal called "MyChart".  Sign up information is provided on this After Visit Summary.  MyChart is used to connect with patients for Virtual Visits (Telemedicine).  Patients are able to view lab/test results, encounter notes, upcoming appointments, etc.  Non-urgent messages can be sent to your provider as well.   To learn more about what you can do with MyChart, go to [facility].    Your next appointment:   2-3 week(s)  The format for your next appointment:   Virtual Visit   Provider:   You may see [email or web address], MD or one of the following Advanced Practice Providers on your designated  Care Team:   Julien Nordmann, NP  Nicolasa Ducking, PA-C  Eula Listen, PA-C  Cadence Marisue Ivan, Fransico Michael  [place], NP  Other Instructions   Tips to Measure your Blood Pressure Correctly  To determine whether you have hypertension, a medical professional will take a blood pressure reading. How you prepare for the test, the position of your arm, and other factors can change a blood pressure reading by 10% or more. That could be enough to hide high blood pressure, start you on a drug you don't really need, or lead your doctor to incorrectly adjust your medications.  National and international guidelines offer specific instructions for measuring blood pressure. If a doctor, nurse, or medical assistant isn't doing it right, don't hesitate to ask him or her to get with the guidelines.  Here's what you can do to ensure a correct reading: . Don't drink a caffeinated beverage or smoke during the 30 minutes before the test. . Sit quietly for five minutes before the test begins. . During the measurement, sit in a chair with your feet on the floor and your arm supported so your elbow is at about heart level. . The inflatable part of the cuff should completely cover at least 80% of your upper arm, and the cuff should be placed on bare skin, not over a shirt. . Don't talk during the measurement. . Have your blood pressure measured twice, with a brief break in between. If the readings are different by 5 points or more, have it done a  third time.  There are times to break these rules. If you sometimes feel lightheaded when getting out of bed in the morning or when you stand after sitting, you should have your blood pressure checked while seated and then while standing to see if it falls from one position to the next.  Because blood pressure varies throughout the day, your doctor will rarely diagnose hypertension on the basis of a single reading. Instead, he or she will want to confirm the measurements  on at least two occasions, usually within a few weeks of one another. The exception to this rule is if you have a blood pressure reading of 180/110 mm Hg or higher. A result this high usually calls for prompt treatment.  It's a good idea to have your blood pressure measured in both arms at least once, since the reading in one arm (usually the right) may be higher than that in the left. A 2014 study in The American Journal of Medicine of nearly 3,400 people found average arm- to-arm differences in systolic blood pressure of about 5 points. The higher number should be used to make treatment decisions.  In 2017, new guidelines from the American Heart Association, the [employer] of Cardiology, and nine other health organizations lowered the diagnosis of high blood pressure to 130/80 mm Hg or higher for all adults. The guidelines also redefined the various blood pressure categories to now include normal, elevated, Stage 1 hypertension, Stage 2 hypertension, and hypertensive crisis (see "Blood pressure categories").  Blood pressure categories  Blood pressure category SYSTOLIC (upper number)  DIASTOLIC (lower number)  Normal Less than 120 mm Hg and Less than 80 mm Hg  Elevated 120-129 mm Hg and Less than 80 mm Hg  High blood pressure: Stage 1 hypertension 130-139 mm Hg or 80-89 mm Hg  High blood pressure: Stage 2 hypertension 140 mm Hg or higher or 90 mm Hg or higher  Hypertensive crisis (consult your doctor immediately) Higher than 180 mm Hg and/or Higher than 120 mm Hg  Source: American Heart Association and American Stroke Association. For more on getting your blood pressure under control, buy Controlling Your Blood Pressure, a Special Health Report from [facility]   Blood Pressure Log   Date   Time  Blood Pressure  Position  Example: Nov 1 9 AM 124/78 sitting

## 2020-04-18 ENCOUNTER — Telehealth: Payer: Self-pay | Admitting: *Deleted

## 2020-04-18 DIAGNOSIS — Z79899 Other long term (current) drug therapy: Secondary | ICD-10-CM

## 2020-04-18 DIAGNOSIS — E782 Mixed hyperlipidemia: Secondary | ICD-10-CM

## 2020-04-18 LAB — TSH: TSH: 1.24 u[IU]/mL (ref 0.450–4.500)

## 2020-04-18 LAB — COMPREHENSIVE METABOLIC PANEL
ALT: 30 IU/L (ref 0–44)
AST: 27 IU/L (ref 0–40)
Albumin/Globulin Ratio: 1.4 (ref 1.2–2.2)
Albumin: 4 g/dL (ref 4.0–5.0)
Alkaline Phosphatase: 80 IU/L (ref 44–121)
BUN/Creatinine Ratio: 14 (ref 9–20)
BUN: 14 mg/dL (ref 6–24)
Bilirubin Total: 0.3 mg/dL (ref 0.0–1.2)
CO2: 23 mmol/L (ref 20–29)
Calcium: 9.5 mg/dL (ref 8.7–10.2)
Chloride: 101 mmol/L (ref 96–106)
Creatinine, Ser: 0.97 mg/dL (ref 0.76–1.27)
GFR calc Af Amer: 107 mL/min/{1.73_m2} (ref >59)
GFR calc non Af Amer: 93 mL/min/{1.73_m2} (ref >59)
Globulin, Total: 2.9 g/dL (ref 1.5–4.5)
Glucose: 94 mg/dL (ref 65–99)
Potassium: 4.4 mmol/L (ref 3.5–5.2)
Sodium: 138 mmol/L (ref 134–144)
Total Protein: 6.9 g/dL (ref 6.0–8.5)

## 2020-04-18 LAB — LIPID PANEL
Chol/HDL Ratio: 4.9 ratio (ref 0.0–5.0)
Cholesterol, Total: 241 mg/dL — ABNORMAL HIGH (ref 100–199)
HDL: 49 mg/dL (ref >39)
LDL Chol Calc (NIH): 165 mg/dL — ABNORMAL HIGH (ref 0–99)
Triglycerides: 150 mg/dL — ABNORMAL HIGH (ref 0–149)
VLDL Cholesterol Cal: 27 mg/dL (ref 5–40)

## 2020-04-18 LAB — CBC WITH DIFFERENTIAL/PLATELET
Basophils Absolute: 0 10*3/uL (ref 0.0–0.2)
Basos: 1 %
EOS (ABSOLUTE): 0.2 10*3/uL (ref 0.0–0.4)
Eos: 3 %
Hematocrit: 46.1 % (ref 37.5–51.0)
Hemoglobin: 15.6 g/dL (ref 13.0–17.7)
Immature Grans (Abs): 0 10*3/uL (ref 0.0–0.1)
Immature Granulocytes: 0 %
Lymphocytes Absolute: 1.7 10*3/uL (ref 0.7–3.1)
Lymphs: 27 %
MCH: 29.3 pg (ref 26.6–33.0)
MCHC: 33.8 g/dL (ref 31.5–35.7)
MCV: 87 fL (ref 79–97)
Monocytes Absolute: 0.6 10*3/uL (ref 0.1–0.9)
Monocytes: 9 %
Neutrophils Absolute: 3.9 10*3/uL (ref 1.4–7.0)
Neutrophils: 60 %
Platelets: 327 10*3/uL (ref 150–450)
RBC: 5.32 x10E6/uL (ref 4.14–5.80)
RDW: 12.3 % (ref 11.6–15.4)
WBC: 6.4 10*3/uL (ref 3.4–10.8)

## 2020-04-18 LAB — HEMOGLOBIN A1C
Est. average glucose Bld gHb Est-mCnc: 105 mg/dL
Hgb A1c MFr Bld: 5.3 % (ref 4.8–5.6)

## 2020-04-18 NOTE — Telephone Encounter (Signed)
-----   Message from Alver Sorrow, NP sent at 04/18/2020  7:57 AM EST ----- Lab work with normal kidney, liver, thyroid function. Normal electrolytes and CBC. A1c looked good with no evidence of diabetes. Cholesterol panel not at goal - total cholesterol 241 (goal <200), LDL 165 (goal <100). Crestor refilled at clinic 04/17/20, recommend taking once per day consistently with repeat lipid panel/liver function at [facility] in 8 weeks

## 2020-04-18 NOTE — Telephone Encounter (Signed)
Spoke to pt, notified of lab results and recc below. Pt verbalized understanding. He will take Crestor 10mg  daily and will have repeat lipid and Lft labwork done at [facility] in 8 weeks. Lab orders printed and mailed to pt. Pt has no further questions at this time.

## 2020-05-15 ENCOUNTER — Telehealth: Payer: Self-pay | Admitting: Family

## 2020-05-15 NOTE — Telephone Encounter (Signed)
No answer. Left detailed message that patient needs fasting lab work around February 8th. On the message, said we had mailed lab slips to him so he could go to a [facility] near him. Advised him to call us back if he has any further questions.

## 2020-05-15 NOTE — Telephone Encounter (Signed)
Patient would like a call back to discuss his labwork orders. Patient also states if he does not answer, it is okay to leave a detailed message on his voicemail.

## 2020-05-16 ENCOUNTER — Telehealth: Payer: BC Managed Care – PPO | Admitting: Family

## 2020-07-24 ENCOUNTER — Telehealth: Payer: Self-pay | Admitting: Cardiovascular Disease

## 2020-07-24 NOTE — Telephone Encounter (Signed)
  Patient Consent for Virtual Visit         Jesse Horn, Jesse Horn has provided verbal consent on 07/24/2020 for a virtual visit (video or telephone).   CONSENT FOR VIRTUAL VISIT FOR:  Jesse Horn, Jesse Horn  By participating in this virtual visit I agree to the following:  I hereby voluntarily request, consent and authorize [facility] and its employed or contracted physicians, physician assistants, nurse practitioners or other licensed health care professionals (the Practitioner), to provide me with telemedicine health care services (the "Services") as deemed necessary by the treating Practitioner. I acknowledge and consent to receive the Services by the Practitioner via telemedicine. I understand that the telemedicine visit will involve communicating with the Practitioner through live audiovisual communication technology and the disclosure of certain medical information by electronic transmission. I acknowledge that I have been given the opportunity to request an in-person assessment or other available alternative prior to the telemedicine visit and am voluntarily participating in the telemedicine visit.  I understand that I have the right to withhold or withdraw my consent to the use of telemedicine in the course of my care at any time, without affecting my right to future care or treatment, and that the Practitioner or I may terminate the telemedicine visit at any time. I understand that I have the right to inspect all information obtained and/or recorded in the course of the telemedicine visit and may receive copies of available information for a reasonable fee.  I understand that some of the potential risks of receiving the Services via telemedicine include:  Marland Kitchen Delay or interruption in medical evaluation due to technological equipment failure or disruption; . Information transmitted may not be sufficient (e.g. poor resolution of images) to allow for appropriate medical decision making by the  Practitioner; and/or  . In rare instances, security protocols could fail, causing a breach of personal health information.  Furthermore, I acknowledge that it is my responsibility to provide information about my medical history, conditions and care that is complete and accurate to the best of my ability. I acknowledge that Practitioner's advice, recommendations, and/or decision may be based on factors not within their control, such as incomplete or inaccurate data provided by me or distortions of diagnostic images or specimens that may result from electronic transmissions. I understand that the practice of medicine is not an exact science and that Practitioner makes no warranties or guarantees regarding treatment outcomes. I acknowledge that a copy of this consent can be made available to me via my patient portal [facility] MyChart), or I can request a printed copy by calling the office of [facility].    I understand that my insurance will be billed for this visit.   I have read or had this consent read to me. . I understand the contents of this consent, which adequately explains the benefits and risks of the Services being provided via telemedicine.  . I have been provided ample opportunity to ask questions regarding this consent and the Services and have had my questions answered to my satisfaction. . I give my informed consent for the services to be provided through the use of telemedicine in my medical care

## 2020-07-25 ENCOUNTER — Other Ambulatory Visit: Payer: Self-pay

## 2020-07-25 ENCOUNTER — Telehealth (INDEPENDENT_AMBULATORY_CARE_PROVIDER_SITE_OTHER): Payer: BC Managed Care – PPO | Admitting: Physician Assistant

## 2020-07-25 VITALS — BP 147/90 | HR 84 | Ht 67.0 in | Wt 215.0 lb

## 2020-07-25 DIAGNOSIS — I10 Essential (primary) hypertension: Secondary | ICD-10-CM

## 2020-07-25 DIAGNOSIS — E782 Mixed hyperlipidemia: Secondary | ICD-10-CM

## 2020-07-25 MED ORDER — LOSARTAN POTASSIUM 50 MG PO TABS: 50.0000 mg | ORAL_TABLET | Freq: Every day | ORAL | 3 refills | Status: DC

## 2020-07-25 NOTE — Patient Instructions (Addendum)
Medication Instructions:  Your physician has recommended you make the following change in your medication:   1. INCREASE Losartan to 50 mg once daily  *If you need a refill on your cardiac medications before your next appointment, please call your pharmacy*   Lab Work: Lipid panel and CMET We will mail the lab slips to you.   If you have labs (blood work) drawn today and your tests are completely normal, you will receive your results only by: [employer] MyChart Message (if you have MyChart) OR . A paper copy in the mail If you have any lab test that is abnormal or we need to change your treatment, we will call you to review the results.   Testing/Procedures: None   Follow-Up: At [facility], you and your health needs are our priority.  As part of our continuing mission to provide you with exceptional heart care, we have created designated Provider Care Teams.  These Care Teams include your primary Cardiologist (physician) and Advanced Practice Providers (APPs -  Physician Assistants and Nurse Practitioners) who all work together to provide you with the care you need, when you need it.   Your next appointment:   4 month(s)  The format for your next appointment:   In Person  Provider:   Julien Nordmann, MD or Eula Listen, PA-C

## 2020-07-25 NOTE — Progress Notes (Signed)
Virtual Visit via Telephone Note   This visit type was conducted due to national recommendations for restrictions regarding the COVID-19 Pandemic (e.g. social distancing) in an effort to limit this patient's exposure and mitigate transmission in our community.  Due to his co-morbid illnesses, this patient is at least at moderate risk for complications without adequate follow up.  This format is felt to be most appropriate for this patient at this time.  The patient did not have access to video technology/had technical difficulties with video requiring transitioning to audio format only (telephone).  All issues noted in this document were discussed and addressed.  No physical exam could be performed with this format.  Please refer to the patient's chart for his  consent to telehealth for [facility].    Date:  07/25/2020   ID:  [name], Jesse Horn, DOB 1970/07/19, MRN [id number] The patient was identified using 2 identifiers.  Patient Location: Home Provider Location: Office/Clinic   PCP:  Patient, No Pcp Per   [name]  Cardiologist:  Ida Rogue, MD  Electrophysiologist:  None  [phone]   Evaluation Performed:  Follow-Up Visit  Chief Complaint:  Follow up  History of Present Illness:    Jesse Horn, Jesse Horn is a 50 y.o. male with HTN, HLD, palpitations, and insomnia who presents for telehealth follow-up of hypertension.  Prior coronary artery calcium score in 2015 of 0.  Historically he has noted elevated BP readings with encouragement for lifestyle changes.  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  He reported intermittently monitoring his BP at home with readings routinely greater than 130/80.  BP in our office was noted to be 160/74.  He continued to indicate difficulty sleeping and was  taking sleep aids.  No prior sleep study on file.  There was some concern for physical deconditioning.  Given significantly elevated BP readings he was started on losartan 25 mg daily.  He is seen virtually via telephone today and has been doing well since his last visit.  He has been adherent with losartan 25 mg without issues.  BP still remains elevated, mostly in the 140s over upper 80s at baseline despite the addition of losartan.  For the most part he has not seen any significantly elevated readings outside of when he presented back to his endodontist's office for the above mentioned a root canal and his BP was again noted to be in the 569V systolic.  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  With his busy schedule it is difficult to find times for a heart healthy diet though he does not add salt to foods in general.  He does not believe he snores and only drinks 3-4 alcoholic beverages per week.   Labs independently reviewed: 04/2020 -A1c 5.3, TSH normal, TC 241, TG 150, HDL 49, LDL 165, Hgb 15.6, PLT 327, potassium 4.4, BUN 14, serum creatinine 0.97, albumin 4.0, AST/ALT normal   The patient does not have symptoms concerning for COVID-19 infection (fever, chills, cough, or new shortness of breath).    Past Medical History:  Diagnosis Date  . GERD (gastroesophageal reflux disease)   . Hiatal hernia   . Hyperlipidemia  Past Surgical History:  Procedure Laterality Date  . ELBOW ARTHROSCOPY  2005   right  . ULNAR NERVE REPAIR     exploratory right, chronic ulnar nerve partial     Current Meds  Medication Sig  . losartan (COZAAR) 50 MG tablet Take 1 tablet (50 mg total) by mouth daily.  . rosuvastatin (CRESTOR) 10 MG tablet Take 1 tablet (10 mg total) by mouth daily.  . tadalafil (CIALIS) 5 MG tablet Take 5 mg by mouth daily as needed for erectile  dysfunction.  . [DISCONTINUED] losartan (COZAAR) 25 MG tablet Take 1 tablet (25 mg total) by mouth daily.     Allergies:   Sulfonamide derivatives   Social History   Tobacco Use  . Smoking status: Never Smoker  . Smokeless tobacco: Never Used  Vaping Use  . Vaping Use: Never used  Substance Use Topics  . Alcohol use: Yes    Comment: Drinks 3 beer weekly.   . Drug use: No     Family Hx: The patient's family history includes Heart disease in his maternal grandfather and paternal grandfather; Hyperlipidemia in his father, maternal grandmother, paternal grandmother, and paternal uncle; Hypertension in his father, maternal grandmother, paternal grandmother, and paternal uncle.  ROS:   Please see the history of present illness.     All other systems reviewed and are negative.   Prior CV studies:   The following studies were reviewed today:  Calcium score 09/2013: IMPRESSION: Coronary calcium score of 0. This was 0 percentile for age and sex matched control.  Labs/Other Tests and Data Reviewed:    EKG:  An ECG dated 04/20/2020 was personally reviewed today and demonstrated:  NSR, 63 bpm, no acute ST-T changes  Recent Labs: 04/17/2020: ALT 30; BUN 14; Creatinine, Ser 0.97; Hemoglobin 15.6; Platelets 327; Potassium 4.4; Sodium 138; TSH 1.240   Recent Lipid Panel Lab Results  Component Value Date/Time   CHOL 241 (H) 04/17/2020 10:59 AM   TRIG 150 (H) 04/17/2020 10:59 AM   HDL 49 04/17/2020 10:59 AM   CHOLHDL 4.9 04/17/2020 10:59 AM   CHOLHDL 4 04/02/2011 07:53 AM   LDLCALC 165 (H) 04/17/2020 10:59 AM   LDLDIRECT 97.8 06/28/2012 01:00 PM    Wt Readings from Last 3 Encounters:  07/25/20 215 lb (97.5 kg)  04/17/20 209 lb (94.8 kg)  03/16/19 192 lb (87.1 kg)     Risk Assessment/Calculations:     N/A  Objective:    Vital Signs:  BP (!) 147/90 (BP Location: Left Arm, Patient Position: Sitting)   Pulse 84   Ht _0  (1.702 m)   Wt 215 lb (97.5 kg)   BMI 33.67  kg/m    VITAL SIGNS:  reviewed  ASSESSMENT & PLAN:    1. HTN: Blood pressure is improved though does remain above goal.  Titrate losartan to 50 mg daily.  We have mailed a lab order for him to have a CMP drawn at his convenience with the results being faxed to me.  Low-sodium diet recommended.  In an effort to minimize his stress and time consumption he will have labs drawn locally in [place], [place] and will follow up in person with our office in approximately 4 months to reassess BP.  2. HLD: LDL 165 in 04/2020 with normal LFT at that time leading to the initiation of Crestor.  Lab slip mailed to have CMP and lipid panel drawn.  Further recommendations regarding statin management based on these labs.  Heart healthy diet recommended.  Time:   Today, I have spent 10 minutes with the patient with telehealth technology discussing the above problems.     Medication Adjustments/Labs and Tests Ordered: Current medicines are reviewed at length with the patient today.  Concerns regarding medicines are outlined above.   Tests Ordered: Orders Placed This Encounter  Procedures  . Comp Met (CMET)  . Lipid panel    Medication Changes: Meds ordered this encounter  Medications  . losartan (COZAAR) 50 MG tablet    Sig: Take 1 tablet (50 mg total) by mouth daily.    Dispense:  90 tablet    Refill:  3    Follow Up:  In Person in 4 month(s)  Signed, Christell Faith, PA-C  07/25/2020 4:21 PM    [facility]

## 2020-11-20 ENCOUNTER — Telehealth: Payer: Self-pay

## 2020-11-20 NOTE — Telephone Encounter (Signed)
    Patient Consent for Virtual Visit        Jesse Horn, Jesse Horn has provided verbal consent on 11/20/2020 for a virtual visit (video or telephone).   CONSENT FOR VIRTUAL VISIT FOR:  Jesse Horn, Jesse Horn  By participating in this virtual visit I agree to the following:  I hereby voluntarily request, consent and authorize [facility] and its employed or contracted physicians, physician assistants, nurse practitioners or other licensed health care professionals (the Practitioner), to provide me with telemedicine health care services (the "Services") as deemed necessary by the treating Practitioner. I acknowledge and consent to receive the Services by the Practitioner via telemedicine. I understand that the telemedicine visit will involve communicating with the Practitioner through live audiovisual communication technology and the disclosure of certain medical information by electronic transmission. I acknowledge that I have been given the opportunity to request an in-person assessment or other available alternative prior to the telemedicine visit and am voluntarily participating in the telemedicine visit.  I understand that I have the right to withhold or withdraw my consent to the use of telemedicine in the course of my care at any time, without affecting my right to future care or treatment, and that the Practitioner or I may terminate the telemedicine visit at any time. I understand that I have the right to inspect all information obtained and/or recorded in the course of the telemedicine visit and may receive copies of available information for a reasonable fee.  I understand that some of the potential risks of receiving the Services via telemedicine include:  Delay or interruption in medical evaluation due to technological equipment failure or disruption; Information transmitted may not be sufficient (e.g. poor resolution of images) to allow for appropriate medical decision making by the  Practitioner; and/or  In rare instances, security protocols could fail, causing a breach of personal health information.  Furthermore, I acknowledge that it is my responsibility to provide information about my medical history, conditions and care that is complete and accurate to the best of my ability. I acknowledge that Practitioner's advice, recommendations, and/or decision may be based on factors not within their control, such as incomplete or inaccurate data provided by me or distortions of diagnostic images or specimens that may result from electronic transmissions. I understand that the practice of medicine is not an exact science and that Practitioner makes no warranties or guarantees regarding treatment outcomes. I acknowledge that a copy of this consent can be made available to me via my patient portal [facility] MyChart), or I can request a printed copy by calling the office of [facility].    I understand that my insurance will be billed for this visit.   I have read or had this consent read to me. I understand the contents of this consent, which adequately explains the benefits and risks of the Services being provided via telemedicine.  I have been provided ample opportunity to ask questions regarding this consent and the Services and have had my questions answered to my satisfaction. I give my informed consent for the services to be provided through the use of telemedicine in my medical care

## 2020-11-30 ENCOUNTER — Ambulatory Visit: Payer: BC Managed Care – PPO | Admitting: Physician Assistant

## 2020-12-17 ENCOUNTER — Other Ambulatory Visit: Payer: Self-pay | Admitting: *Deleted

## 2020-12-17 ENCOUNTER — Telehealth: Payer: Self-pay | Admitting: Cardiovascular Disease

## 2020-12-17 DIAGNOSIS — E782 Mixed hyperlipidemia: Secondary | ICD-10-CM

## 2020-12-17 MED ORDER — ROSUVASTATIN CALCIUM 10 MG PO TABS: 10.0000 mg | ORAL_TABLET | Freq: Every day | ORAL | 0 refills | Status: DC

## 2020-12-17 NOTE — Telephone Encounter (Signed)
rosuvastatin (CRESTOR) 10 MG tablet 90 tablet 0 12/17/2020    Sig - Route: Take 1 tablet (10 mg total) by mouth daily. - Oral   Sent to pharmacy as: rosuvastatin (CRESTOR) 10 MG tablet   E-Prescribing Status: Sent to pharmacy (12/17/2020  9:18 AM EDT)

## 2020-12-17 NOTE — Telephone Encounter (Signed)
*  STAT* If patient is at the pharmacy, call can be transferred to refill team.   1. Which medications need to be refilled? (please list name of each medication and dose if known) rosuvastatin 10 MG 1 tablet daily   2. Which pharmacy/location (including street and city if local pharmacy) is medication to be sent to? [facility] in [place]   3. Do they need a 30 day or 90 day supply? 90 day

## 2020-12-19 ENCOUNTER — Other Ambulatory Visit: Payer: Self-pay | Admitting: Physician Assistant

## 2020-12-20 LAB — COMPREHENSIVE METABOLIC PANEL
ALT: 38 IU/L (ref 0–44)
AST: 27 IU/L (ref 0–40)
Albumin/Globulin Ratio: 1.7 (ref 1.2–2.2)
Albumin: 4.3 g/dL (ref 4.0–5.0)
Alkaline Phosphatase: 89 IU/L (ref 44–121)
BUN/Creatinine Ratio: 12 (ref 9–20)
BUN: 13 mg/dL (ref 6–24)
Bilirubin Total: 0.6 mg/dL (ref 0.0–1.2)
CO2: 25 mmol/L (ref 20–29)
Calcium: 9.8 mg/dL (ref 8.7–10.2)
Chloride: 100 mmol/L (ref 96–106)
Creatinine, Ser: 1.11 mg/dL (ref 0.76–1.27)
Globulin, Total: 2.6 g/dL (ref 1.5–4.5)
Glucose: 100 mg/dL — ABNORMAL HIGH (ref 65–99)
Potassium: 4.6 mmol/L (ref 3.5–5.2)
Sodium: 139 mmol/L (ref 134–144)
Total Protein: 6.9 g/dL (ref 6.0–8.5)
eGFR: 82 mL/min/{1.73_m2} (ref >59)

## 2020-12-20 LAB — LIPID PANEL W/O CHOL/HDL RATIO
Cholesterol, Total: 187 mg/dL (ref 100–199)
HDL: 49 mg/dL (ref >39)
LDL Chol Calc (NIH): 114 mg/dL — ABNORMAL HIGH (ref 0–99)
Triglycerides: 138 mg/dL (ref 0–149)
VLDL Cholesterol Cal: 24 mg/dL (ref 5–40)

## 2020-12-20 LAB — SPECIMEN STATUS REPORT

## 2021-01-04 ENCOUNTER — Other Ambulatory Visit: Payer: Self-pay

## 2021-01-04 ENCOUNTER — Encounter: Payer: Self-pay | Admitting: Cardiovascular Disease

## 2021-01-04 ENCOUNTER — Telehealth (INDEPENDENT_AMBULATORY_CARE_PROVIDER_SITE_OTHER): Payer: BC Managed Care – PPO | Admitting: Cardiovascular Disease

## 2021-01-04 VITALS — BP 141/86 | HR 84 | Ht 67.0 in | Wt 208.0 lb

## 2021-01-04 DIAGNOSIS — I10 Essential (primary) hypertension: Secondary | ICD-10-CM

## 2021-01-04 DIAGNOSIS — E782 Mixed hyperlipidemia: Secondary | ICD-10-CM | POA: Diagnosis not present

## 2021-01-04 DIAGNOSIS — R002 Palpitations: Secondary | ICD-10-CM

## 2021-01-04 MED ORDER — ROSUVASTATIN CALCIUM 10 MG PO TABS: 10.0000 mg | ORAL_TABLET | Freq: Every day | ORAL | 3 refills | Status: DC

## 2021-01-04 MED ORDER — TADALAFIL 5 MG PO TABS
5.0000 mg | ORAL_TABLET | Freq: Every day | ORAL | 5 refills | Status: AC | PRN
Start: 2021-01-04 — End: ?

## 2021-01-04 MED ORDER — LOSARTAN POTASSIUM 50 MG PO TABS: 50.0000 mg | ORAL_TABLET | Freq: Every day | ORAL | 3 refills | Status: DC

## 2021-01-04 NOTE — Progress Notes (Signed)
Virtual Visit via Video Note   This visit type was conducted due to national recommendations for restrictions regarding the COVID-19 Pandemic (e.g. social distancing) in an effort to limit this patient's exposure and mitigate transmission in our community.  Due to his co-morbid illnesses, this patient is at least at moderate risk for complications without adequate follow up.  This format is felt to be most appropriate for this patient at this time.  All issues noted in this document were discussed and addressed.  A limited physical exam was performed with this format.  Please refer to the patient's chart for his consent to telehealth for [facility].   I connected with  Jesse Horn, Jesse Horn on 01/04/21 by a video enabled telemedicine application and verified that I am speaking with the correct person using two identifiers. I discussed the limitations of evaluation and management by telemedicine. The patient expressed understanding and agreed to proceed.   Evaluation Performed:  Follow-up visit  Date:  01/04/2021   ID:  Jesse Horn, Jesse Horn, DOB June 19, 1970, MRN [id number]  Patient Location:  [address] [place] [address]   Provider location:   [facility], [place] office  PCP:  Patient, No Pcp Per (Inactive)  Cardiologist:  Fonnie Mu   Chief Complaint  Patient presents with   4 month follow up     "Doing well." Medications reviewed by the patient verbally.     History of Present Illness:    Jesse Horn, Jesse Horn is a 50 y.o. male who presents via audio/video conferencing for a telehealth visit today.   The patient does not symptoms concerning for COVID-19 infection (fever, chills, cough, or new SHORTNESS OF BREATH).   Patient has a past medical history of hypertension,  hyperlipidemia,  CT coronary calcium score 2015 was zero who presents for f/u of his palpitations, hyperlipidemia, high blood pressure  Daughter is 21 1/2 yr old, 19 month  old  Working long hours, [employer] On last telemetry visit with our office losartan up to 50 daily  BP elevated from stress Sold house/bought new house, commuting  Total chol improved 187    04/2020 -A1c 5.3, TSH normal, TC 241, TG 150, HDL 49, LDL 165, Hgb 15.6, PLT 327, potassium 4.4, BUN 14, serum creatinine 0.97, albumin 4.0, AST/ALT normal Numbers better on Crestor 10 previous LDL 107   CT coronary calcium score 0 in  2015    Past Medical History:  Diagnosis Date   GERD (gastroesophageal reflux disease)    Hiatal hernia    Hyperlipidemia    Past Surgical History:  Procedure Laterality Date   ELBOW ARTHROSCOPY  2005   right   ULNAR NERVE REPAIR     exploratory right, chronic ulnar nerve partial     Allergies:   Sulfonamide derivatives   Social History   Tobacco Use   Smoking status: Never   Smokeless tobacco: Never  Vaping Use   Vaping Use: Never used  Substance Use Topics   Alcohol use: Yes    Comment: Drinks 3 beer weekly.    Drug use: No     Current Outpatient Medications on File Prior to Visit  Medication Sig Dispense Refill   losartan (COZAAR) 50 MG tablet Take 1 tablet (50 mg total) by mouth daily. 90 tablet 3   rosuvastatin (CRESTOR) 10 MG tablet Take 1 tablet (10 mg total) by mouth daily. 90 tablet 0   tadalafil (CIALIS) 5 MG tablet Take  5 mg by mouth daily as needed for erectile dysfunction.     No current facility-administered medications on file prior to visit.     Family Hx: The patient's family history includes Heart disease in his maternal grandfather and paternal grandfather; Hyperlipidemia in his father, maternal grandmother, paternal grandmother, and paternal uncle; Hypertension in his father, maternal grandmother, paternal grandmother, and paternal uncle.  ROS:   Please see the history of present illness.    Review of Systems  Constitutional: Negative.   Respiratory: Negative.    Cardiovascular: Negative.   Gastrointestinal:  Negative.   Musculoskeletal: Negative.   Neurological: Negative.   Psychiatric/Behavioral: Negative.    All other systems reviewed and are negative.   Labs/Other Tests and Data Reviewed:    Recent Labs: 04/17/2020: Hemoglobin 15.6; Platelets 327; TSH 1.240 12/19/2020: ALT 38; BUN 13; Creatinine, Ser 1.11; Potassium 4.6; Sodium 139   Recent Lipid Panel Lab Results  Component Value Date/Time   CHOL 187 12/19/2020 08:24 AM   TRIG 138 12/19/2020 08:24 AM   HDL 49 12/19/2020 08:24 AM   CHOLHDL 4.9 04/17/2020 10:59 AM   CHOLHDL 4 04/02/2011 07:53 AM   LDLCALC 114 (H) 12/19/2020 08:24 AM   LDLDIRECT 97.8 06/28/2012 01:00 PM    Wt Readings from Last 3 Encounters:  01/04/21 208 lb (94.3 kg)  07/25/20 215 lb (97.5 kg)  04/17/20 209 lb (94.8 kg)     Exam:    Vital Signs: Vital signs may also be detailed in the HPI BP (!) 141/86   Pulse 84   Ht 5\' 7"  (1.702 m)   Wt 208 lb (94.3 kg)   BMI 32.58 kg/m    Constitutional:  oriented to person, place, and time. No distress.  Psychiatric: normal affect, pleasant   ASSESSMENT & PLAN:    Problem List Items Addressed This Visit       Cardiology Problems   Hyperlipidemia - Primary   Essential hypertension   Other Visit Diagnoses     Palpitations         Essential hypertension -  Elevated in setting of moving, selling his house On losartan 50, recommended extra 50 as needed for high blood pressure  Mixed hyperlipidemia - On crestor , LDL improved  Palpitations Stable   Total encounter time more than 15 minutes Greater than 50% was spent in counseling and coordination of care with the patient     Signed, , MD  [facility] [address], [place], [place]

## 2021-01-04 NOTE — Addendum Note (Signed)
Addended by: Maple Hudson on: 01/04/2021 08:58 AM   Modules accepted: Orders

## 2021-01-04 NOTE — Patient Instructions (Addendum)
Medication Instructions:  No changes Your current cardiac medications were refilled for another year  If you need a refill on your cardiac medications before your next appointment, please call your pharmacy.    Lab work: No new labs needed  Testing/Procedures: No new testing needed  Follow-Up: At [facility], you and your health needs are our priority.  As part of our continuing mission to provide you with exceptional heart care, we have created designated Provider Care Teams.  These Care Teams include your primary Cardiologist (physician) and Advanced Practice Providers (APPs -  Physician Assistants and Nurse Practitioners) who all work together to provide you with the care you need, when you need it.  You will need a follow up appointment in 12 months The office will send a letter in the mail around the 1 year mark to remind you to call and schedule your appointment   Providers on your designated Care Team:   Nicolasa Ducking, NP Eula Listen, PA-C Marisue Ivan, PA-C Cadence [place], [place]   COVID-19 Vaccine Information can be found at: [email or web address] For questions related to vaccine distribution or appointments, please email [email or web address] or call [phone].

## 2022-01-06 NOTE — Progress Notes (Signed)
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 [place] [address]   Provider location:   [facility], [place] office  PCP:  Patient, No Pcp Per  Cardiologist:  Fonnie Mu   Chief Complaint  Patient presents with   12 month follow up     "Doing well." Medications reviewed by the patient verbally.     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 property, lots of work to maintain Working long hours, dental school  Blood pressure well controlled Tolerating Crestor 10 daily  Lab work reviewed, in 2022 Total cholesterol 187 down from 241 LDL 114 down from 165,   CT coronary calcium score 0 in  2015  Past Medical History:  Diagnosis Date   GERD (gastroesophageal reflux disease)    Hiatal hernia    Hyperlipidemia    Past Surgical History:  Procedure Laterality Date   ELBOW ARTHROSCOPY  2005   right   ULNAR NERVE REPAIR     exploratory right, chronic ulnar nerve partial     Allergies:   Sulfonamide derivatives   Social History   Tobacco Use   Smoking status: Never   Smokeless tobacco: Never  Vaping Use   Vaping Use: Never used  Substance Use Topics   Alcohol use: Yes    Comment: Drinks 3 beer weekly.    Drug use: No     Current Outpatient Medications on File Prior to Visit  Medication Sig Dispense Refill   losartan (COZAAR) 50 MG tablet Take 1 tablet (50 mg total) by mouth daily. 90  tablet 3   rosuvastatin (CRESTOR) 10 MG tablet Take 1 tablet (10 mg total) by mouth daily. 90 tablet 3   tadalafil (CIALIS) 5 MG tablet Take 1 tablet (5 mg total) by mouth daily as needed for erectile dysfunction. 10 tablet 5   No current facility-administered medications on file prior to visit.     Family Hx: The patient's family history includes Heart disease in his maternal grandfather and paternal grandfather; Hyperlipidemia in his father, maternal grandmother, paternal grandmother, and paternal uncle; Hypertension in his father, maternal grandmother, paternal grandmother, and paternal uncle.  ROS:   Please see the history of present illness.    Review of Systems  Constitutional: Negative.   Respiratory: Negative.    Cardiovascular: Negative.   Gastrointestinal: Negative.   Musculoskeletal: Negative.   Neurological: Negative.   Psychiatric/Behavioral: Negative.    All other systems reviewed and are negative.    Labs/Other Tests and Data Reviewed:    Recent Labs: No results found for requested labs within last 365 days.   Recent Lipid Panel Lab Results  Component Value Date/Time   CHOL 187 12/19/2020 08:24 AM   TRIG 138 12/19/2020 08:24 AM   HDL 49 12/19/2020 08:24 AM   CHOLHDL 4.9 04/17/2020 10:59 AM   CHOLHDL 4 04/02/2011 07:53 AM  LDLCALC 114 (H) 12/19/2020 08:24 AM   LDLDIRECT 97.8 06/28/2012 01:00 PM    Wt Readings from Last 3 Encounters:  01/07/22 204 lb 6 oz (92.7 kg)  01/04/21 208 lb (94.3 kg)  07/25/20 215 lb (97.5 kg)     Exam:    Vital Signs: Vital signs may also be detailed in the HPI BP 120/86 (BP Location: Left Arm, Patient Position: Sitting, Cuff Size: Normal)   Pulse 70   Ht 5\' 7"  (1.702 m)   Wt 204 lb 6 oz (92.7 kg)   BMI 32.01 kg/m    Constitutional:  oriented to person, place, and time. No distress.  HENT:  Head: Grossly normal Eyes:  no discharge. No scleral icterus.  Neck: No JVD, no carotid bruits  Cardiovascular: Regular rate and  rhythm, no murmurs appreciated Pulmonary/Chest: Clear to auscultation bilaterally, no wheezes or rails Abdominal: Soft.  no distension.  no tenderness.  Musculoskeletal: Normal range of motion Neurological:  normal muscle tone. Coordination normal. No atrophy Skin: Skin warm and dry Psychiatric: normal affect, pleasant  ASSESSMENT & PLAN:    Problem List Items Addressed This Visit       Cardiology Problems   Hyperlipidemia   Relevant Orders   EKG 12-Lead   Essential hypertension - Primary   Relevant Orders   EKG 12-Lead   Other Visit Diagnoses     Palpitations         Essential hypertension -  Blood pressure is well controlled on today's visit. No changes made to the medications.  Losartan refilled  Mixed hyperlipidemia - On crestor 10 mg daily, LDL much improved Medication refilled  Palpitations Stable   Total encounter time more than 15 minutes Greater than 50% was spent in counseling and coordination of care with the patient     Signed, , MD  [facility] [address], [place], [place]

## 2022-01-07 ENCOUNTER — Encounter: Payer: Self-pay | Admitting: Cardiovascular Disease

## 2022-01-07 ENCOUNTER — Ambulatory Visit: Payer: BC Managed Care – PPO | Attending: Cardiovascular Disease | Admitting: Cardiovascular Disease

## 2022-01-07 VITALS — BP 120/86 | HR 70 | Ht 67.0 in | Wt 204.4 lb

## 2022-01-07 DIAGNOSIS — I10 Essential (primary) hypertension: Secondary | ICD-10-CM

## 2022-01-07 DIAGNOSIS — R002 Palpitations: Secondary | ICD-10-CM

## 2022-01-07 DIAGNOSIS — E782 Mixed hyperlipidemia: Secondary | ICD-10-CM

## 2022-01-07 MED ORDER — ROSUVASTATIN CALCIUM 10 MG PO TABS: 10.0000 mg | ORAL_TABLET | Freq: Every day | ORAL | 3 refills | Status: DC

## 2022-01-07 MED ORDER — ROSUVASTATIN CALCIUM 10 MG PO TABS: 10.0000 mg | ORAL_TABLET | Freq: Every day | ORAL | 3 refills | Status: AC

## 2022-01-07 MED ORDER — LOSARTAN POTASSIUM 50 MG PO TABS: 50.0000 mg | ORAL_TABLET | Freq: Every day | ORAL | 3 refills | Status: DC

## 2022-01-07 MED ORDER — LOSARTAN POTASSIUM 50 MG PO TABS: 50.0000 mg | ORAL_TABLET | Freq: Every day | ORAL | 3 refills | Status: AC

## 2022-01-07 NOTE — Patient Instructions (Signed)
Medication Instructions:  No changes  If you need a refill on your cardiac medications before your next appointment, please call your pharmacy.   Lab work: [facility] slip: lipids/LFTs in 6 months   Testing/Procedures: No new testing needed  Follow-Up: At [facility], you and your health needs are our priority.  As part of our continuing mission to provide you with exceptional heart care, we have created designated Provider Care Teams.  These Care Teams include your primary Cardiologist (physician) and Advanced Practice Providers (APPs -  Physician Assistants and Nurse Practitioners) who all work together to provide you with the care you need, when you need it.  You will need a follow up appointment in 12 months  Providers on your designated Care Team:   Nicolasa Ducking, NP Eula Listen, PA-C Cadence [place], [place]  COVID-19 Vaccine Information can be found at: [email or web address] For questions related to vaccine distribution or appointments, please email [email or web address] or call [phone].

## 2022-03-13 ENCOUNTER — Telehealth: Payer: Self-pay | Admitting: Cardiovascular Disease

## 2022-03-13 NOTE — Telephone Encounter (Signed)
Pt c/o medication issue:  1. Name of Medication: losartan (COZAAR) 50 MG tablet   2. How are you currently taking this medication (dosage and times per day)? lTake 1 tablet (50 mg total) by mouth daily.   3. Are you having a reaction (difficulty breathing--STAT)? no  4. What is your medication issue? Having joint pain, wanted to know if it will be okay to take ibuprofen while take the above medication. Please advise

## 2022-03-13 NOTE — Telephone Encounter (Signed)
Spoke w/ pt and advised him of Melissa's recommendation. He reports that he has been working out for about 4 mos, has tendonitis in both elbows and some days he is sore. He states that he will probably take ibuprofen prn for a couple of days to get the inflammation down. He has lost about 20 lbs and hopes to lose 20 more, so he is hoping that he can either cut back on losartan soon or stop it completely. Asked him to call back w/ any other questions or concerns.

## 2022-03-13 NOTE — Telephone Encounter (Signed)
Use of NSAIDs such as IBU or naproxen can increase blood pressure. Long term, higher doses of NSAIDs can also increase the risk of MI and stoke, kidney injury and GI bleeding.  Would probably be ok to take IBU on occasion, but routine use should be avoided of possible. Trial of APAP would be recommended.

## 2024-01-07 NOTE — Progress Notes (Unsigned)
 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 [place] [address]   Provider location:   [facility], [place] office  PCP:  Patient, No Pcp Per  Cardiologist:  Perla CRIS Nicolas   Chief Complaint  Patient presents with   12 month follow up     Patient c/o chronic cough & elevated blood pressure.     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  Blood pressure well controlled Tolerating Crestor  10 daily  No recent labs available Lab work reviewed, in 2022 Total cholesterol 187 down from 241 LDL 114 down from 165,   CT coronary calcium  score 0 in  2015  EKG personally reviewed by myself on todays visit EKG Interpretation Date/Time:  Friday January 08 2024 09:24:25 EDT Ventricular Rate:  68 PR Interval:  166 QRS Duration:  92 QT Interval:  372 QTC Calculation: 395 R Axis:   -4  Text Interpretation: Normal sinus rhythm Normal ECG No previous ECGs available Confirmed by Perla Lye [phone]) on 01/08/2024 9:31:15 AM    Past Medical History:  Diagnosis Date   GERD (gastroesophageal reflux disease)    Hiatal hernia    Hyperlipidemia    Past Surgical History:  Procedure Laterality Date   ELBOW ARTHROSCOPY  2005   right   ULNAR NERVE REPAIR     exploratory right, chronic ulnar nerve partial     Allergies:    Sulfamethoxazole-trimethoprim and Sulfonamide derivatives   Social History   Tobacco Use   Smoking status: Never   Smokeless tobacco: Never  Vaping Use   Vaping status: Never Used  Substance Use Topics   Alcohol use: Yes    Comment: Drinks 3 beer weekly.    Drug use: No     Current Outpatient Medications on File Prior to Visit  Medication Sig Dispense Refill   albuterol (VENTOLIN HFA) 108 (90 Base) MCG/ACT inhaler Inhale 1-2 puffs into the lungs every 4 (four) hours as needed.     BREO ELLIPTA 100-25 MCG/ACT AEPB Inhale 1 puff into the lungs daily.     esomeprazole (NEXIUM) 40 MG capsule Take 40 mg by mouth daily at 12 noon.     losartan  (COZAAR ) 50 MG tablet Take 1 tablet (50 mg total) by mouth daily. 90 tablet 3   rosuvastatin  (CRESTOR ) 10 MG tablet Take 1 tablet (10 mg total) by mouth daily. 90 tablet 3   tadalafil  (CIALIS ) 5 MG tablet Take 1 tablet (5 mg total) by mouth daily as needed for erectile dysfunction. 10 tablet 5   No current facility-administered medications on file prior to visit.     Family Hx: The patient's family history includes Heart disease in his maternal grandfather and paternal grandfather; Hyperlipidemia in his father, maternal grandmother, paternal grandmother, and paternal uncle; Hypertension in his father, maternal grandmother, paternal grandmother, and paternal uncle.  ROS:  Please see the history of present illness.    Review of Systems  Constitutional: Negative.   Respiratory: Negative.    Cardiovascular: Negative.   Gastrointestinal: Negative.   Musculoskeletal: Negative.   Neurological: Negative.   Psychiatric/Behavioral: Negative.    All other systems reviewed and are negative.    Labs/Other Tests and Data Reviewed:    Recent Labs: No results found for requested labs within last 365 days.   Recent Lipid Panel Lab Results  Component Value Date/Time   CHOL 187 12/19/2020 08:24 AM   TRIG 138 12/19/2020 08:24 AM   HDL 49 12/19/2020  08:24 AM   CHOLHDL 4.9 04/17/2020 10:59 AM   CHOLHDL 4 04/02/2011 07:53 AM   LDLCALC 114 (H) 12/19/2020 08:24 AM   LDLDIRECT 97.8 06/28/2012 01:00 PM    Wt Readings from Last 3 Encounters:  01/08/24 206 lb 8 oz (93.7 kg)  01/07/22 204 lb 6 oz (92.7 kg)  01/04/21 208 lb (94.3 kg)     Exam:    Vital Signs: Vital signs may also be detailed in the HPI BP 120/88 (BP Location: Left Arm, Patient Position: Sitting, Cuff Size: Normal)   Pulse 68   Ht 5' 7 (1.702 m)   Wt 206 lb 8 oz (93.7 kg)   SpO2 96%   BMI 32.34 kg/m    Constitutional:  oriented to person, place, and time. No distress.  HENT:  Head: Grossly normal Eyes:  no discharge. No scleral icterus.  Neck: No JVD, no carotid bruits  Cardiovascular: Regular rate and rhythm, no murmurs appreciated Pulmonary/Chest: Clear to auscultation bilaterally, no wheezes or rails Abdominal: Soft.  no distension.  no tenderness.  Musculoskeletal: Normal range of motion Neurological:  normal muscle tone. Coordination normal. No atrophy Skin: Skin warm and dry Psychiatric: normal affect, pleasant   ASSESSMENT & PLAN:    Problem List Items Addressed This Visit       Cardiology Problems   Hyperlipidemia - Primary   Essential hypertension   Relevant Orders   EKG 12-Lead (Completed)   Essential hypertension -  Blood pressure is well controlled on today's visit. No changes made to the medications.  Mixed hyperlipidemia - On crestor  10 mg daily,  He prefers to have lab work done next year after he has a chance to exercise, he will call us  when he would like lab work done prior to next clinic visit  Palpitations Stable       Signed, Evalene Lunger, MD  [facility] [address], [place], [place] [address]

## 2024-01-08 ENCOUNTER — Encounter: Payer: Self-pay | Admitting: Cardiovascular Disease

## 2024-01-08 ENCOUNTER — Ambulatory Visit: Payer: Self-pay | Attending: Cardiovascular Disease | Admitting: Cardiovascular Disease

## 2024-01-08 VITALS — BP 120/88 | HR 68 | Ht 67.0 in | Wt 206.5 lb

## 2024-01-08 DIAGNOSIS — E782 Mixed hyperlipidemia: Secondary | ICD-10-CM | POA: Diagnosis not present

## 2024-01-08 DIAGNOSIS — I10 Essential (primary) hypertension: Secondary | ICD-10-CM | POA: Diagnosis not present

## 2024-01-08 NOTE — Patient Instructions (Signed)

## 2024-03-30 ENCOUNTER — Telehealth: Payer: Self-pay | Admitting: Cardiovascular Disease

## 2024-03-30 NOTE — Telephone Encounter (Signed)
 Pt calling to get information regarding referral from DR. Gollan. Please advise.

## 2024-03-30 NOTE — Telephone Encounter (Signed)
 Pt called in stating that at his recent visit with Dr Perla they had spoke about his previous illness with pneumonia and he is still experiencing an ongoing cough. Patient is seeing a pulmonologist in [place] where he lives and states he has still not received a diagnosis for this and is becoming increasingly frustrated as I can understand. States he has been on multiple antibiotic treatments for this ongoing issue. He would like to have some pulmonary referral recommendations and is open to driving wherever. He states he has done some research and is open to seeing a doctor at [facility] as he believes his symptoms are from this and believes they can manage it better. However he is open to all recommendations that Dr Gollan may have in regards to pulmonary referral.   Sending to Dr Gollan for recommendations and referral.

## 2024-04-04 ENCOUNTER — Other Ambulatory Visit: Payer: Self-pay | Admitting: Emergency Medicine

## 2024-04-04 DIAGNOSIS — J189 Pneumonia, unspecified organism: Secondary | ICD-10-CM
